# Patient Record
Sex: Male | Born: 1980 | Race: White | Hispanic: No | Marital: Single | Smoking: Current every day smoker
Health system: Southern US, Community
[De-identification: ages and names within clinical notes are randomized; demographics above are authoritative.]

## PROBLEM LIST (undated history)

## (undated) DIAGNOSIS — C719 Malignant neoplasm of brain, unspecified: Secondary | ICD-10-CM

## (undated) DIAGNOSIS — M1990 Unspecified osteoarthritis, unspecified site: Secondary | ICD-10-CM

## (undated) DIAGNOSIS — J45909 Unspecified asthma, uncomplicated: Secondary | ICD-10-CM

## (undated) DIAGNOSIS — G9389 Other specified disorders of brain: Secondary | ICD-10-CM

## (undated) DIAGNOSIS — R569 Unspecified convulsions: Secondary | ICD-10-CM

## (undated) HISTORY — PX: HERNIA REPAIR: SHX51

## (undated) HISTORY — PX: BACK SURGERY: SHX140

## (undated) HISTORY — PX: PILONIDAL CYST EXCISION: SHX744

---

## 1999-07-26 ENCOUNTER — Ambulatory Visit (HOSPITAL_COMMUNITY): Admission: RE | Admit: 1999-07-26 | Discharge: 1999-07-26 | Payer: Self-pay | Admitting: General Surgery

## 2000-07-11 ENCOUNTER — Ambulatory Visit (HOSPITAL_COMMUNITY): Admission: RE | Admit: 2000-07-11 | Discharge: 2000-07-11 | Payer: Self-pay | Admitting: General Surgery

## 2011-12-10 ENCOUNTER — Emergency Department: Payer: Self-pay | Admitting: Emergency Medicine

## 2014-08-12 ENCOUNTER — Other Ambulatory Visit: Payer: Self-pay | Admitting: Surgery

## 2015-08-13 ENCOUNTER — Inpatient Hospital Stay (HOSPITAL_COMMUNITY): Payer: BLUE CROSS/BLUE SHIELD

## 2015-08-13 ENCOUNTER — Inpatient Hospital Stay (HOSPITAL_COMMUNITY)
Admission: EM | Admit: 2015-08-13 | Discharge: 2015-08-14 | DRG: 055 | Disposition: A | Discharge status: Home / Self Care | Payer: BLUE CROSS/BLUE SHIELD | Attending: Internal Medicine | Admitting: Internal Medicine

## 2015-08-13 ENCOUNTER — Encounter (HOSPITAL_COMMUNITY): Payer: Self-pay | Admitting: Emergency Medicine

## 2015-08-13 ENCOUNTER — Emergency Department (HOSPITAL_COMMUNITY): Payer: BLUE CROSS/BLUE SHIELD

## 2015-08-13 DIAGNOSIS — F17200 Nicotine dependence, unspecified, uncomplicated: Secondary | ICD-10-CM | POA: Diagnosis present

## 2015-08-13 DIAGNOSIS — C711 Malignant neoplasm of frontal lobe: Principal | ICD-10-CM | POA: Diagnosis present

## 2015-08-13 DIAGNOSIS — Z79899 Other long term (current) drug therapy: Secondary | ICD-10-CM | POA: Diagnosis not present

## 2015-08-13 DIAGNOSIS — G9389 Other specified disorders of brain: Secondary | ICD-10-CM | POA: Diagnosis present

## 2015-08-13 DIAGNOSIS — R569 Unspecified convulsions: Secondary | ICD-10-CM

## 2015-08-13 DIAGNOSIS — G939 Disorder of brain, unspecified: Secondary | ICD-10-CM | POA: Diagnosis not present

## 2015-08-13 HISTORY — DX: Unspecified osteoarthritis, unspecified site: M19.90

## 2015-08-13 LAB — BASIC METABOLIC PANEL
Anion gap: 10 (ref 5–15)
BUN: 9 mg/dL (ref 6–20)
CHLORIDE: 104 mmol/L (ref 101–111)
CO2: 23 mmol/L (ref 22–32)
CREATININE: 1.11 mg/dL (ref 0.61–1.24)
Calcium: 9.4 mg/dL (ref 8.9–10.3)
GFR calc Af Amer: >60 mL/min (ref >60)
GFR calc non Af Amer: >60 mL/min (ref >60)
GLUCOSE: 115 mg/dL — AB (ref 65–99)
Potassium: 4.1 mmol/L (ref 3.5–5.1)
SODIUM: 137 mmol/L (ref 135–145)

## 2015-08-13 LAB — CBC
HCT: 45.5 % (ref 39.0–52.0)
Hemoglobin: 15.2 g/dL (ref 13.0–17.0)
MCH: 30.2 pg (ref 26.0–34.0)
MCHC: 33.4 g/dL (ref 30.0–36.0)
MCV: 90.3 fL (ref 78.0–100.0)
PLATELETS: 244 10*3/uL (ref 150–400)
RBC: 5.04 MIL/uL (ref 4.22–5.81)
RDW: 12 % (ref 11.5–15.5)
WBC: 6.7 10*3/uL (ref 4.0–10.5)

## 2015-08-13 LAB — URINALYSIS, ROUTINE W REFLEX MICROSCOPIC
Bilirubin Urine: NEGATIVE
Glucose, UA: NEGATIVE mg/dL
Hgb urine dipstick: NEGATIVE
KETONES UR: NEGATIVE mg/dL
LEUKOCYTES UA: NEGATIVE
NITRITE: NEGATIVE
PROTEIN: NEGATIVE mg/dL
Specific Gravity, Urine: 1.014 (ref 1.005–1.030)
pH: 5.5 (ref 5.0–8.0)

## 2015-08-13 LAB — RAPID URINE DRUG SCREEN, HOSP PERFORMED
Amphetamines: POSITIVE — AB
BARBITURATES: NOT DETECTED
Benzodiazepines: NOT DETECTED
Cocaine: NOT DETECTED
OPIATES: NOT DETECTED
TETRAHYDROCANNABINOL: POSITIVE — AB

## 2015-08-13 LAB — ETHANOL: Alcohol, Ethyl (B): <5 mg/dL (ref <5)

## 2015-08-13 LAB — CBG MONITORING, ED: Glucose-Capillary: 127 mg/dL — ABNORMAL HIGH (ref 65–99)

## 2015-08-13 MED ORDER — SODIUM CHLORIDE 0.9 % IV SOLN
500.0000 mg | Freq: Two times a day (BID) | INTRAVENOUS | Status: DC
  Administered 2015-08-14: 500 mg via INTRAVENOUS
  Filled 2015-08-13 (×3): qty 5

## 2015-08-13 MED ORDER — ONDANSETRON HCL 4 MG PO TABS: 4.0000 mg | ORAL_TABLET | Freq: Four times a day (QID) | ORAL | Status: DC | PRN

## 2015-08-13 MED ORDER — GADOBENATE DIMEGLUMINE 529 MG/ML IV SOLN
20.0000 mL | Freq: Once | INTRAVENOUS | Status: AC | PRN
Start: 2015-08-13 — End: 2015-08-13
  Administered 2015-08-13: 20 mL via INTRAVENOUS

## 2015-08-13 MED ORDER — ONDANSETRON HCL 4 MG/2ML IJ SOLN: 4.0000 mg | Freq: Four times a day (QID) | INTRAMUSCULAR | Status: DC | PRN

## 2015-08-13 MED ORDER — SODIUM CHLORIDE 0.9% FLUSH: 3.0000 mL | Freq: Two times a day (BID) | INTRAVENOUS | Status: DC

## 2015-08-13 MED ORDER — ACETAMINOPHEN 650 MG RE SUPP: 650.0000 mg | Freq: Four times a day (QID) | RECTAL | Status: DC | PRN

## 2015-08-13 MED ORDER — IBUPROFEN 400 MG PO TABS: 400.0000 mg | ORAL_TABLET | Freq: Four times a day (QID) | ORAL | Status: DC | PRN

## 2015-08-13 MED ORDER — ACETAMINOPHEN 325 MG PO TABS
650.0000 mg | ORAL_TABLET | Freq: Four times a day (QID) | ORAL | Status: DC | PRN
Start: 2015-08-13 — End: 2015-08-14

## 2015-08-13 MED ORDER — SODIUM CHLORIDE 0.9 % IV SOLN
1000.0000 mg | Freq: Once | INTRAVENOUS | Status: AC
  Administered 2015-08-13: 1000 mg via INTRAVENOUS
  Filled 2015-08-13: qty 10

## 2015-08-13 MED ORDER — AMPHETAMINE-DEXTROAMPHETAMINE 10 MG PO TABS
30.0000 mg | ORAL_TABLET | Freq: Every day | ORAL | Status: DC
  Administered 2015-08-14: 30 mg via ORAL
  Filled 2015-08-13: qty 3

## 2015-08-13 NOTE — Consult Note (Signed)
Reason for Consult:seizure Referring Physician:Browning PA-C  Joe Butler is an 35 y.o. male.  HPI: Joe Butler is a 35 y.o. male with No significant medical history presents to ED with seizures. History provided by family members.he was at work when he had a generalized seizure for 3 to 4 minutes, witnessed. He was confused for 40 minutes after the seizure episode followed with a moderate headache. He denies any complaijnts prior to the seizure activity . He denies using any drugs. U\DS is pending. Initial CT head showed Probable large mass within the left frontal lobe, measuring at least 4 x 3.7 cm on image 21 of series 201, but with suspected mass extension and/or associated edema within the more inferior aspects of the left frontal lobe. Neurology consulted , recommended 1gm IV keppra. He was referred to medical service for admission. I saw patient in consultation and reviewed his MRI with him and his family.  Past Medical History  Diagnosis Date  . Arthritis     Past Surgical History  Procedure Laterality Date  . Back surgery      No family history on file.  Social History:  reports that he has been smoking.  He does not have any smokeless tobacco history on file. He reports that he uses illicit drugs (Marijuana). He reports that he does not drink alcohol.  Allergies:  Allergies  Allergen Reactions  . Bee Venom Anaphylaxis  . Shellfish Allergy Anaphylaxis  . Latex Rash  . Iodine Rash    Medications: I have reviewed the patient's current medications.  Results for orders placed or performed during the hospital encounter of 08/13/15 (from the past 48 hour(s))  CBG monitoring, ED     Status: Abnormal   Collection Time: 08/13/15  1:05 PM  Result Value Ref Range   Glucose-Capillary 127 (H) 65 - 99 mg/dL  Basic metabolic panel - if new onset seizures     Status: Abnormal   Collection Time: 08/13/15  1:32 PM  Result Value Ref Range   Sodium 137 135 - 145 mmol/L    Potassium 4.1 3.5 - 5.1 mmol/L   Chloride 104 101 - 111 mmol/L   CO2 23 22 - 32 mmol/L   Glucose, Bld 115 (H) 65 - 99 mg/dL   BUN 9 6 - 20 mg/dL   Creatinine, Ser 1.11 0.61 - 1.24 mg/dL   Calcium 9.4 8.9 - 10.3 mg/dL   GFR calc non Af Amer >60 >60 mL/min   GFR calc Af Amer >60 >60 mL/min    Comment: (NOTE) The eGFR has been calculated using the CKD EPI equation. This calculation has not been validated in all clinical situations. eGFR's persistently <60 mL/min signify possible Chronic Kidney Disease.    Anion gap 10 5 - 15  CBC - if new onset seizures     Status: None   Collection Time: 08/13/15  1:32 PM  Result Value Ref Range   WBC 6.7 4.0 - 10.5 K/uL   RBC 5.04 4.22 - 5.81 MIL/uL   Hemoglobin 15.2 13.0 - 17.0 g/dL   HCT 45.5 39.0 - 52.0 %   MCV 90.3 78.0 - 100.0 fL   MCH 30.2 26.0 - 34.0 pg   MCHC 33.4 30.0 - 36.0 g/dL   RDW 12.0 11.5 - 15.5 %   Platelets 244 150 - 400 K/uL  Ethanol     Status: None   Collection Time: 08/13/15  3:32 PM  Result Value Ref Range   Alcohol, Ethyl (B) <  5 <5 mg/dL    Comment:        LOWEST DETECTABLE LIMIT FOR SERUM ALCOHOL IS 5 mg/dL FOR MEDICAL PURPOSES ONLY     Ct Head Wo Contrast  08/13/2015  CLINICAL DATA:  Seizure today.  Frontal headache. EXAM: CT HEAD WITHOUT CONTRAST TECHNIQUE: Contiguous axial images were obtained from the base of the skull through the vertex without intravenous contrast. COMPARISON:  None. FINDINGS: Brain: There is prominent mass effect within the left frontal lobe, with effacement of the left lateral ventricle and a rightward shift of the anterior falx. There is a probable mass within the left frontal lobe which measures at least 4 x 3.7 cm on image 21 of series 201. Suspected mass extension and/or associated edema within the more inferior aspects of the left frontal lobe, with possible extension across the anterior corpus callosum. Vascular: Majority of the visible 36intracranial vessels are hyperdense suggesting  pseudo-subarachnoid sign caused by surrounding brain edema. No convincing evidence of parenchymal or extra-axial hemorrhage. Skull: Negative for fracture or focal lesion. Sinuses/Orbits: No acute findings. Other: None. IMPRESSION: Probable large mass within the left frontal lobe, measuring at least 4 x 3.7 cm on image 21 of series 201, but with suspected mass extension and/or associated edema within the more inferior aspects of the left frontal lobe. There is associated mass effect on the frontal horn of the left lateral ventricle and rightward shift of the anterior falx. Leading differential possibilities include neoplastic masses such as GBM and lymphoma. Recommend MRI with contrast for further characterization. These results were called by telephone at the time of interpretation on 08/13/2015 at 3:05 pm to Dr. Montine Circle , who verbally acknowledged these results. Electronically Signed   By: Franki Cabot M.D.   On: 08/13/2015 15:10   Mr [name] NW Contrast  08/13/2015  CLINICAL DATA:  Witnessed seizure earlier today. Normal neurologic exam. EXAM: MRI HEAD WITHOUT AND WITH CONTRAST TECHNIQUE: Multiplanar, multiecho pulse sequences of the brain and surrounding structures were obtained without and with intravenous contrast. CONTRAST:  MultiHance 20 mL. COMPARISON:  CT head performed a few hours earlier. FINDINGS: Infiltrative LEFT frontal mass, mildly restricted on DWI over a 3 cm cross-sectional area. Infiltrative T2 and FLAIR hyperintensities throughout the entire frontal lobe, infiltration of the corpus callosum across the genu and body, also involving the LEFT insula, and LEFT temporal lobe as well as LEFT basal ganglia with similar abnormal signal in the RIGHT frontal lobe. Focal and confluent T2 and FLAIR hyperintensities in the RIGHT centrum semiovale and around the parieto-occipital periatrial regions, are possibly, but not clearly related. Diffuse mass effect in the LEFT frontal lobe with sulcal  effacement. Mild mass effect left-to-right, 4 mm, with slight LEFT uncal herniation, and brainstem rotation. Post infusion, there are scattered foci of enhancement, relatively minor and out of proportion to the degree of mass effect and T2 hyperintensity. The largest area of enhancement in the LEFT superior frontal cortex measures 16 x 7 x 18 mm (R-L x A-P x C-C). There is a curvilinear focus of enhancement also noted in the medial RIGHT frontal lobe (image 19 series 17). No areas of hemorrhage or central necrosis. Flow voids are maintained.  Extracranial soft tissues unremarkable. IMPRESSION: Infiltrative mass involving multiple brain regions, epicenter in the LEFT frontal lobe, with contralateral spread, mild restriction, disproportionate paucity of enhancement, and no central necrosis or hemorrhage. 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 Advanced, infiltrative, GBM cannot be excluded. Electronically Signed   By: Staci Righter  M.D.   On: 08/13/2015 18:22    Review of Systems - Negative except Headaches for 2 months. Recent back surgery for L5S1 disc herniation    Blood pressure 106/70, pulse 83, temperature 97.6 F (36.4 C), temperature source Oral, resp. rate 15, height 6' (1.829 m), weight 90.719 kg (200 lb), SpO2 95 %. Physical Exam  Constitutional: He is oriented to person, place, and time. He appears well-developed and well-nourished.  HENT:  Head: Normocephalic and atraumatic.  Eyes: Conjunctivae and EOM are normal. Pupils are equal, round, and reactive to light.  Neck: Normal range of motion. Neck supple.  Cardiovascular: Normal rate and regular rhythm.   Respiratory: Effort normal and breath sounds normal.  GI: Soft. Bowel sounds are normal.  Musculoskeletal: Normal range of motion.  Neurological: He is alert and oriented to person, place, and time. He has normal strength and normal reflexes. He displays no atrophy, no tremor and normal reflexes. No cranial nerve deficit or sensory deficit. He  exhibits normal muscle tone. He displays no seizure activity. Coordination normal. GCS eye subscore is 4. GCS verbal subscore is 5. GCS motor subscore is 6.  Reflex Scores:      Tricep reflexes are 2+ on the right side and 2+ on the left side.      Bicep reflexes are 2+ on the right side and 2+ on the left side.      Brachioradialis reflexes are 2+ on the right side and 2+ on the left side.      Patellar reflexes are 2+ on the right side and 2+ on the left side.      Achilles reflexes are 2+ on the right side and 2+ on the left side. Psychiatric: He has a normal mood and affect. His speech is normal. Judgment and thought content normal. Cognition and memory are normal.    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  Admit to Medicine Service.  Started on Keppra.  Will likely require stereotactic biopsy for this new infiltrative lesion, most likely a glioma.  Peggyann Shoals, MD 08/13/2015, 7:00 PM

## 2015-08-13 NOTE — ED Notes (Signed)
Pt arrives from work via [employer].  EMS reports pt had witnessed "full-body" seizure at work lasting apprx 3 minutes.  EMS reports pt's coworkers stated no fall, no injury trauma.  EMS reports pt was post-ictal with loss of bladder. Pt AO x 4 at this time, reports no hx seizures.

## 2015-08-13 NOTE — H&P (Addendum)
History and Physical    Joe Butler [id number] DOB: March 15, 1980 DOA: 08/13/2015  PCP: No primary care provider on file. Patient coming from: home  Chief Complaint: seizures.  HPI: Joe Butler is a 35 y.o. male with  No significant medical history presents to ED with seizures. History provided by family members.he was at work when he had a generalized seizure for 3 to 4 minutes, witnessed. He was confused for 40 minutes after the seizure episode followed with a moderate headache. He denies any complaijnts prior to the seizure activity . He denies using any drugs. U\DS is pending. Initial CT head showed Probable large mass within the left frontal lobe, measuring at least 4 x 3.7 cm on image 21 of series 201, but with suspected mass extension and/or associated edema within the more inferior aspects of the left frontal lobe. Neurology consulted , recommended 1gm IV keppra. He was referred to medical service for admission.   ED Course: WAS GIVEN IV KEPPRA. MRI DONE.   Review of Systems: As per HPI otherwise 10 point review of systems negative.    Past Medical History  Diagnosis Date  . Arthritis     Past Surgical History  Procedure Laterality Date  . Back surgery       reports that he has been smoking.  He does not have any smokeless tobacco history on file. He reports that he uses illicit drugs (Marijuana). He reports that he does not drink alcohol.  Allergies  Allergen Reactions  . Bee Venom Anaphylaxis  . Shellfish Allergy Anaphylaxis  . Latex Rash  . Iodine Rash    Family history Father has lung cancer with mets to brain undergoing chemo MI in father with PCI.    Prior to Admission medications   Medication Sig Start Date End Date Taking? Authorizing Provider  amphetamine-dextroamphetamine (ADDERALL) 30 MG tablet Take 30 mg by mouth daily.   Yes Historical Provider, MD  Desloratadine-Pseudoephedrine (CLARINEX-D 12 HOUR PO) Take 1 tablet by mouth daily as needed  (allergies).   Yes Historical Provider, MD    Physical Exam: Filed Vitals:   08/13/15 1430 08/13/15 1500 08/13/15 1530 08/13/15 1600  BP: 105/72 104/71 107/75 106/70  Pulse: 125 77 84 83  Temp:      TempSrc:      Resp: 19 19 18 15   Height:      Weight:      SpO2: 95% 96% 96% 95%      Constitutional: NAD, calm, comfortable Filed Vitals:   08/13/15 1430 08/13/15 1500 08/13/15 1530 08/13/15 1600  BP: 105/72 104/71 107/75 106/70  Pulse: 125 77 84 83  Temp:      TempSrc:      Resp: 19 19 18 15   Height:      Weight:      SpO2: 95% 96% 96% 95%   Eyes: PERRL, lids and conjunctivae normal ENMT: Mucous membranes are moist. Posterior pharynx clear of any exudate or lesions.Normal dentition.  Neck: normal, supple, no masses, no thyromegaly Respiratory: clear to auscultation bilaterally, no wheezing, no crackles. Normal respiratory effort. No accessory muscle use.  Cardiovascular: Regular rate and rhythm, no murmurs / rubs / gallops. No extremity edema. 2+ pedal pulses. No carotid bruits.  Abdomen: no tenderness, no masses palpated. No hepatosplenomegaly. Bowel sounds positive.  Musculoskeletal: no clubbing / cyanosis. No joint deformity upper and lower extremities. Good ROM, no contractures. Normal muscle tone.  Skin: no rashes, lesions, ulcers. No induration Neurologic: CN 2-12 grossly intact.  Sensation intact, DTR normal. Strength 5/5 in all 4.  Psychiatric: Normal judgment and insight. Alert and oriented x 3. Normal mood.     Labs on Admission: I have personally reviewed following labs and imaging studies  CBC:  Recent Labs Lab 08/13/15 1332  WBC 6.7  HGB 15.2  HCT 45.5  MCV 90.3  PLT [id number]   Basic Metabolic Panel:  Recent Labs Lab 08/13/15 1332  NA 137  K 4.1  CL 104  CO2 23  GLUCOSE 115*  BUN 9  CREATININE 1.11  CALCIUM 9.4   GFR: Estimated Creatinine Clearance: 101 mL/min (by C-G formula based on Cr of 1.11). Liver Function Tests: No results for  input(s): AST, ALT, ALKPHOS, BILITOT, PROT, ALBUMIN in the last 168 hours. No results for input(s): LIPASE, AMYLASE in the last 168 hours. No results for input(s): AMMONIA in the last 168 hours. Coagulation Profile: No results for input(s): INR, PROTIME in the last 168 hours. Cardiac Enzymes: No results for input(s): CKTOTAL, CKMB, CKMBINDEX, TROPONINI in the last 168 hours. BNP (last 3 results) No results for input(s): PROBNP in the last 8760 hours. HbA1C: No results for input(s): HGBA1C in the last 72 hours. CBG:  Recent Labs Lab 08/13/15 1305  GLUCAP 127*   Lipid Profile: No results for input(s): CHOL, HDL, LDLCALC, TRIG, CHOLHDL, LDLDIRECT in the last 72 hours. Thyroid Function Tests: No results for input(s): TSH, T4TOTAL, FREET4, T3FREE, THYROIDAB in the last 72 hours. Anemia Panel: No results for input(s): VITAMINB12, FOLATE, FERRITIN, TIBC, IRON, RETICCTPCT in the last 72 hours. Urine analysis: No results found for: COLORURINE, APPEARANCEUR, LABSPEC, PHURINE, GLUCOSEU, HGBUR, BILIRUBINUR, KETONESUR, PROTEINUR, UROBILINOGEN, NITRITE, LEUKOCYTESUR Sepsis Labs: !!!!!!!!!!!!!!!!!!!!!!!!!!!!!!!!!!!!!!!!!!!! @LABRCNTIP (procalcitonin:4,lacticidven:4) )No results found for this or any previous visit (from the past 240 hour(s)).   Radiological Exams on Admission: Ct Head Wo Contrast  08/13/2015  CLINICAL DATA:  Seizure today.  Frontal headache. EXAM: CT HEAD WITHOUT CONTRAST TECHNIQUE: Contiguous axial images were obtained from the base of the skull through the vertex without intravenous contrast. COMPARISON:  None. FINDINGS: Brain: There is prominent mass effect within the left frontal lobe, with effacement of the left lateral ventricle and a rightward shift of the anterior falx. There is a probable mass within the left frontal lobe which measures at least 4 x 3.7 cm on image 21 of series 201. Suspected mass extension and/or associated edema within the more inferior aspects of the left  frontal lobe, with possible extension across the anterior corpus callosum. Vascular: Majority of the visible 36intracranial vessels are hyperdense suggesting pseudo-subarachnoid sign caused by surrounding brain edema. No convincing evidence of parenchymal or extra-axial hemorrhage. Skull: Negative for fracture or focal lesion. Sinuses/Orbits: No acute findings. Other: None. IMPRESSION: Probable large mass within the left frontal lobe, measuring at least 4 x 3.7 cm on image 21 of series 201, but with suspected mass extension and/or associated edema within the more inferior aspects of the left frontal lobe. There is associated mass effect on the frontal horn of the left lateral ventricle and rightward shift of the anterior falx. Leading differential possibilities include neoplastic masses such as GBM and lymphoma. Recommend MRI with contrast for further characterization. These results were called by telephone at the time of interpretation on 08/13/2015 at 3:05 pm to Dr. Montine Circle , who verbally acknowledged these results. Electronically Signed   By: Franki Cabot M.D.   On: 08/13/2015 15:10   Mr Joe Butler [id number] Contrast  08/13/2015  CLINICAL DATA:  Witnessed seizure earlier  today. Normal neurologic exam. EXAM: MRI HEAD WITHOUT AND WITH CONTRAST TECHNIQUE: Multiplanar, multiecho pulse sequences of the brain and surrounding structures were obtained without and with intravenous contrast. CONTRAST:  MultiHance 20 mL. COMPARISON:  CT head performed a few hours earlier. FINDINGS: Infiltrative LEFT frontal mass, mildly restricted on DWI over a 3 cm cross-sectional area. Infiltrative T2 and FLAIR hyperintensities throughout the entire frontal lobe, infiltration of the corpus callosum across the genu and body, also involving the LEFT insula, and LEFT temporal lobe as well as LEFT basal ganglia with similar abnormal signal in the RIGHT frontal lobe. Focal and confluent T2 and FLAIR hyperintensities in the RIGHT centrum  semiovale and around the parieto-occipital periatrial regions, are possibly, but not clearly related. Diffuse mass effect in the LEFT frontal lobe with sulcal effacement. Mild mass effect left-to-right, 4 mm, with slight LEFT uncal herniation, and brainstem rotation. Post infusion, there are scattered foci of enhancement, relatively minor and out of proportion to the degree of mass effect and T2 hyperintensity. The largest area of enhancement in the LEFT superior frontal cortex measures 16 x 7 x 18 mm (R-L x A-P x C-C). There is a curvilinear focus of enhancement also noted in the medial RIGHT frontal lobe (image 19 series 17). No areas of hemorrhage or central necrosis. Flow voids are maintained.  Extracranial soft tissues unremarkable. IMPRESSION: Infiltrative mass involving multiple brain regions, epicenter in the LEFT frontal lobe, with contralateral spread, mild restriction, disproportionate paucity of enhancement, and no central necrosis or hemorrhage. 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 Advanced, infiltrative, GBM cannot be excluded. Electronically Signed   By: Staci Righter M.D.   On: 08/13/2015 18:22    EKG: Independently reviewed. Sinus tachy  Assessment/Plan Active Problems:   Brain mass   Seizures (HCC)   Infiltrate left frontal lobe brain mass ? Gliomatosis cerebri vs GBM; Admitted to telemetry, Neuro surgery consulted and plan for biopsy soon.  Pt refusing to take IV decadron  Iv prn ativan for seizures.  Seizure precautions    Seizures probably from the brain mass; IV keppra ordered as per neurology recommendations.  Neurology to follow.    Headache: post ictal headache,  Pain control.   DVT prophylaxis:scd's Code Status: full Family Communication: multiple family members at bedside.  Disposition Plan: pending further management. Consults called:neurology dr Leonel Ramsay, Dr Vertell Limber with neuro surgery.  Admission status: inpatient telemetry.    Hosie Poisson MD [facility] Pager [phone]  If 7PM-7AM, please contact night-coverage [email or web address] Password [facility]  08/13/2015, 6:49 PM

## 2015-08-13 NOTE — ED Notes (Signed)
Attempted to call report

## 2015-08-13 NOTE — ED Provider Notes (Signed)
CSN: [id number]     Arrival date & time 08/13/15  1256 History   First MD Initiated Contact with Patient 08/13/15 1324     Chief Complaint  Patient presents with  . Seizures     (Consider location/radiation/quality/duration/timing/severity/associated sxs/prior Treatment) HPI Comments: Patient presents to the emergency department with chief complaint of seizure. He had a witnessed seizure at work today which lasted approximately 3 minutes and involved full body shaking.  He was noted to be postictal for 20-30 minutes after the fact. He has no seizure history. He reports mild headache. He was lying on the ground when this happened, he did not fall. He denies any other associated symptoms now. He has not taken any medications for this. He denies any numbness, weakness, or tingling in his extremities.  The history is provided by the patient. No language interpreter was used.    Past Medical History  Diagnosis Date  . Arthritis    Past Surgical History  Procedure Laterality Date  . Back surgery     No family history on file. Social History  Substance Use Topics  . Smoking status: Current Every Day Smoker -- 1.00 packs/day  . Smokeless tobacco: None  . Alcohol Use: No    Review of Systems  Constitutional: Negative for fever and chills.  Respiratory: Negative for shortness of breath.   Cardiovascular: Negative for chest pain.  Gastrointestinal: Negative for nausea, vomiting, diarrhea and constipation.  Genitourinary: Negative for dysuria.  Neurological: Positive for seizures.  All other systems reviewed and are negative.     Allergies  Bee venom; Shellfish allergy; Latex; and Iodine  Home Medications   Prior to Admission medications   Medication Sig Start Date End Date Taking? Authorizing Provider  amphetamine-dextroamphetamine (ADDERALL) 30 MG tablet Take 30 mg by mouth daily.   Yes Historical Provider, MD  Desloratadine-Pseudoephedrine (CLARINEX-D 12 HOUR PO) Take 1  tablet by mouth daily as needed (allergies).   Yes Historical Provider, MD   BP 110/71 mmHg  Pulse 104  Temp(Src) 97.6 F (36.4 C) (Oral)  Resp 20  Ht 6' (1.829 m)  Wt 90.719 kg  BMI 27.12 kg/m2  SpO2 93% Physical Exam  Constitutional: He is oriented to person, place, and time. He appears well-developed and well-nourished.  HENT:  Head: Normocephalic and atraumatic.  No oral trauma  Eyes: Conjunctivae and EOM are normal. Pupils are equal, round, and reactive to light. Right eye exhibits no discharge. Left eye exhibits no discharge. No scleral icterus.  Neck: Normal range of motion. Neck supple. No JVD present.  Cardiovascular: Normal rate, regular rhythm and normal heart sounds.  Exam reveals no gallop and no friction rub.   No murmur heard. Pulmonary/Chest: Effort normal and breath sounds normal. No respiratory distress. He has no wheezes. He has no rales. He exhibits no tenderness.  Abdominal: Soft. He exhibits no distension and no mass. There is no tenderness. There is no rebound and no guarding.  Musculoskeletal: Normal range of motion. He exhibits no edema or tenderness.  Moves all extremities ROM and strength 5/5 throughout  Neurological: He is alert and oriented to person, place, and time.  CN 3-12 intact, speech is clear, movements are goal oriented, normal finger to nose, normal heel to shin, no pronator drift, sensation and strength intact throughout  Skin: Skin is warm and dry.  Psychiatric: He has a normal mood and affect. His behavior is normal. Judgment and thought content normal.  Nursing note and vitals reviewed.   ED  Course  Procedures (including critical care time) Results for orders placed or performed during the hospital encounter of [id number]  Basic metabolic panel - if new onset seizures  Result Value Ref Range   Sodium 137 135 - 145 mmol/L   Potassium 4.1 3.5 - 5.1 mmol/L   Chloride 104 101 - 111 mmol/L   CO2 23 22 - 32 mmol/L   Glucose, Bld 115 (H) 65 -  99 mg/dL   BUN 9 6 - 20 mg/dL   Creatinine, Ser 1.11 0.61 - 1.24 mg/dL   Calcium 9.4 8.9 - 10.3 mg/dL   GFR calc non Af Amer >60 >60 mL/min   GFR calc Af Amer >60 >60 mL/min   Anion gap 10 5 - 15  CBC - if new onset seizures  Result Value Ref Range   WBC 6.7 4.0 - 10.5 K/uL   RBC 5.04 4.22 - 5.81 MIL/uL   Hemoglobin 15.2 13.0 - 17.0 g/dL   HCT 45.5 39.0 - 52.0 %   MCV 90.3 78.0 - 100.0 fL   MCH 30.2 26.0 - 34.0 pg   MCHC 33.4 30.0 - 36.0 g/dL   RDW 12.0 11.5 - 15.5 %   Platelets 244 150 - 400 K/uL  CBG monitoring, ED  Result Value Ref Range   Glucose-Capillary 127 (H) 65 - 99 mg/dL   Ct Head Wo Contrast  08/13/2015  CLINICAL DATA:  Seizure today.  Frontal headache. EXAM: CT HEAD WITHOUT CONTRAST TECHNIQUE: Contiguous axial images were obtained from the base of the skull through the vertex without intravenous contrast. COMPARISON:  None. FINDINGS: Brain: There is prominent mass effect within the left frontal lobe, with effacement of the left lateral ventricle and a rightward shift of the anterior falx. There is a probable mass within the left frontal lobe which measures at least 4 x 3.7 cm on image 21 of series 201. Suspected mass extension and/or associated edema within the more inferior aspects of the left frontal lobe, with possible extension across the anterior corpus callosum. Vascular: Majority of the visible 36intracranial vessels are hyperdense suggesting pseudo-subarachnoid sign caused by surrounding brain edema. No convincing evidence of parenchymal or extra-axial hemorrhage. Skull: Negative for fracture or focal lesion. Sinuses/Orbits: No acute findings. Other: None. IMPRESSION: Probable large mass within the left frontal lobe, measuring at least 4 x 3.7 cm on image 21 of series 201, but with suspected mass extension and/or associated edema within the more inferior aspects of the left frontal lobe. There is associated mass effect on the frontal horn of the left lateral ventricle and  rightward shift of the anterior falx. Leading differential possibilities include neoplastic masses such as GBM and lymphoma. Recommend MRI with contrast for further characterization. These results were called by telephone at the time of interpretation on 08/13/2015 at 3:05 pm to Dr. Montine Circle , who verbally acknowledged these results. Electronically Signed   By: Franki Cabot M.D.   On: 08/13/2015 15:10    I have personally reviewed and evaluated these images and lab results as part of my medical decision-making.   MDM   Final diagnoses:  Seizure ([place])  Brain mass    Patient with first-time seizure today. Check labs, CT head, and reassess.  CT with concerning findings as above.  Discussed with Dr. Johnney Killian, who recommends neurosurgery consult.   3:31 PM Discussed patient with Dr. Vertell Limber, who will consult.  Recommends medicine admit and neurology consult.  3:45 PM Discussed patient with Dr. Leonel Ramsay of neurology, who recommends  giving loading dose of 1 g of Keppra, followed by 500 mg Keppra twice a day.  Advises that patient will not require EEG.  PCP is with [facility].  Patient will be an unassigned admission.  Appreciate consultation with Dr. Karleen Hampshire, who will admit the patient.      Montine Circle, PA-C 08/13/15 1606  Charlesetta Shanks, MD 08/24/15 [phone]

## 2015-08-14 ENCOUNTER — Other Ambulatory Visit (HOSPITAL_COMMUNITY): Payer: Self-pay | Admitting: Neurosurgery

## 2015-08-14 DIAGNOSIS — G9389 Other specified disorders of brain: Secondary | ICD-10-CM

## 2015-08-14 LAB — COMPREHENSIVE METABOLIC PANEL
ALBUMIN: 4.1 g/dL (ref 3.5–5.0)
ALK PHOS: 52 U/L (ref 38–126)
ALT: 19 U/L (ref 17–63)
ANION GAP: 10 (ref 5–15)
AST: 26 U/L (ref 15–41)
BILIRUBIN TOTAL: 1.1 mg/dL (ref 0.3–1.2)
BUN: 11 mg/dL (ref 6–20)
CALCIUM: 9.5 mg/dL (ref 8.9–10.3)
CHLORIDE: 106 mmol/L (ref 101–111)
CO2: 23 mmol/L (ref 22–32)
Creatinine, Ser: 1.03 mg/dL (ref 0.61–1.24)
GFR calc non Af Amer: >60 mL/min (ref >60)
Glucose, Bld: 89 mg/dL (ref 65–99)
POTASSIUM: 4.1 mmol/L (ref 3.5–5.1)
SODIUM: 139 mmol/L (ref 135–145)
Total Protein: 7 g/dL (ref 6.5–8.1)

## 2015-08-14 LAB — GLUCOSE, CAPILLARY: GLUCOSE-CAPILLARY: 132 mg/dL — AB (ref 65–99)

## 2015-08-14 LAB — CBC
HEMATOCRIT: 46.3 % (ref 39.0–52.0)
HEMOGLOBIN: 15.3 g/dL (ref 13.0–17.0)
MCH: 30.7 pg (ref 26.0–34.0)
MCHC: 33 g/dL (ref 30.0–36.0)
MCV: 92.8 fL (ref 78.0–100.0)
Platelets: 267 10*3/uL (ref 150–400)
RBC: 4.99 MIL/uL (ref 4.22–5.81)
RDW: 12.3 % (ref 11.5–15.5)
WBC: 9.4 10*3/uL (ref 4.0–10.5)

## 2015-08-14 MED ORDER — LEVETIRACETAM 500 MG PO TABS: 500.0000 mg | ORAL_TABLET | Freq: Two times a day (BID) | ORAL | Status: AC

## 2015-08-14 MED ORDER — PNEUMOCOCCAL VAC POLYVALENT 25 MCG/0.5ML IJ INJ: 0.5000 mL | INJECTION | INTRAMUSCULAR | Status: DC

## 2015-08-14 NOTE — Discharge Summary (Signed)
Physician Discharge Summary  Joe Butler [id number] DOB: 22-Jan-1981 DOA: 08/13/2015  PCP: No primary care provider on file.  Admit date: 08/13/2015 Discharge date: 08/14/2015  Admitted From: (Home) Disposition:  (Home )  Recommendations for Outpatient Follow-up:  1. Follow up with PCP in 1-2 weeks 2. Please obtain BMP/CBC in one week 3. Please follow up with Dr Vertell Limber with neuro surgery for the biopsy next week.     Discharge Condition:guarded.  CODE STATUS:full  Diet recommendation: Heart Healthy  Brief/Interim Summary:Joe Butler is a 35 y.o. 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   Pt refusing to take IV decadron   Seizure precautions Pt wanted to go home, advised not to drive, operate vehiicles, no swimming .    Seizures probably from the brain mass; IV keppra ordered as per neurology recommendations. changed to keppra bID on discharge.   Headache: post ictal headache,  Pain control.    Discharge Instructions  Discharge Instructions    Call MD for:  persistant dizziness or light-headedness    Complete by:  As directed      Call MD for:  persistant nausea and vomiting    Complete by:  As directed      Call MD for:  severe uncontrolled pain    Complete by:  As directed      Call MD for:  temperature >100.4    Complete by:  As directed      Diet general    Complete by:  As directed      Discharge instructions    Complete by:  As directed   Please follow up with Neuro surgery as recommended. Please follow up with PCP in one week. No driving, operating heavy machinery, perform activities at heights, swimming or participation in water activities until release by outpatient physician/ PCP/ Neuro surgery.  This has been discussed with patient.            Medication List    TAKE these medications        amphetamine-dextroamphetamine 30 MG tablet  Commonly known as:  ADDERALL  Take 30 mg by mouth daily.     CLARINEX-D 12 HOUR PO  Take 1 tablet by mouth daily as needed (allergies).     levETIRAcetam 500 MG tablet  Commonly known as:  KEPPRA  Take 1 tablet (500 mg total) by mouth 2 (two) times daily.           Follow-up Information    Follow up with STERN,JOSEPH D, MD. Schedule an appointment as soon as possible for a visit in 1 week.   Specialty:  Neurosurgery   Contact information:   [address] [place] [address] [place] [address] [phone]       Follow up with Primary Care Physician. Schedule an appointment as soon as possible for a visit in 1 week.     Allergies  Allergen Reactions  . Bee Venom Anaphylaxis  . Shellfish Allergy Anaphylaxis  . Latex Rash  . Iodine Rash    Consultations:  Neurology   Neuro surgery.    Procedures/Studies: Ct Head Wo Contrast  08/13/2015  CLINICAL DATA:  Seizure today.  Frontal headache. EXAM: CT HEAD WITHOUT CONTRAST TECHNIQUE: Contiguous axial images were obtained from the  base of the skull through the vertex without intravenous contrast. COMPARISON:  None. FINDINGS: Brain: There is prominent mass effect within the left frontal lobe, with effacement of the left lateral ventricle and a rightward shift of the anterior falx. There is a probable mass within the left frontal lobe which measures at least 4 x 3.7 cm on image 21 of series 201. Suspected mass extension and/or associated edema within the more inferior aspects of the left frontal lobe, with possible extension across the anterior corpus callosum. Vascular: Majority of the visible 36intracranial vessels are hyperdense suggesting pseudo-subarachnoid sign caused by surrounding brain edema. No convincing evidence of parenchymal or extra-axial hemorrhage. Skull: Negative for  fracture or focal lesion. Sinuses/Orbits: No acute findings. Other: None. IMPRESSION: Probable large mass within the left frontal lobe, measuring at least 4 x 3.7 cm on image 21 of series 201, but with suspected mass extension and/or associated edema within the more inferior aspects of the left frontal lobe. There is associated mass effect on the frontal horn of the left lateral ventricle and rightward shift of the anterior falx. Leading differential possibilities include neoplastic masses such as GBM and lymphoma. Recommend MRI with contrast for further characterization. These results were called by telephone at the time of interpretation on 08/13/2015 at 3:05 pm to Dr. Montine Circle , who verbally acknowledged these results. Electronically Signed   By: Franki Cabot M.D.   On: 08/13/2015 15:10   Mr [name] [id number] Contrast  08/13/2015  CLINICAL DATA:  Witnessed seizure earlier today. Normal neurologic exam. EXAM: MRI HEAD WITHOUT AND WITH CONTRAST TECHNIQUE: Multiplanar, multiecho pulse sequences of the brain and surrounding structures were obtained without and with intravenous contrast. CONTRAST:  MultiHance 20 mL. COMPARISON:  CT head performed a few hours earlier. FINDINGS: Infiltrative LEFT frontal mass, mildly restricted on DWI over a 3 cm cross-sectional area. Infiltrative T2 and FLAIR hyperintensities throughout the entire frontal lobe, infiltration of the corpus callosum across the genu and body, also involving the LEFT insula, and LEFT temporal lobe as well as LEFT basal ganglia with similar abnormal signal in the RIGHT frontal lobe. Focal and confluent T2 and FLAIR hyperintensities in the RIGHT centrum semiovale and around the parieto-occipital periatrial regions, are possibly, but not clearly related. Diffuse mass effect in the LEFT frontal lobe with sulcal effacement. Mild mass effect left-to-right, 4 mm, with slight LEFT uncal herniation, and brainstem rotation. Post infusion, there are scattered foci of  enhancement, relatively minor and out of proportion to the degree of mass effect and T2 hyperintensity. The largest area of enhancement in the LEFT superior frontal cortex measures 16 x 7 x 18 mm (R-L x A-P x C-C). There is a curvilinear focus of enhancement also noted in the medial RIGHT frontal lobe (image 19 series 17). No areas of hemorrhage or central necrosis. Flow voids are maintained.  Extracranial soft tissues unremarkable. IMPRESSION: Infiltrative mass involving multiple brain regions, epicenter in the LEFT frontal lobe, with contralateral spread, mild restriction, disproportionate paucity of enhancement, and no central necrosis or hemorrhage. 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 Advanced, infiltrative, GBM cannot be excluded. Electronically Signed   By: Staci Righter M.D.   On: 08/13/2015 18:22       Subjective: Cheerful , wants to g ohome   Discharge Exam: Filed Vitals:   08/14/15 0535 08/14/15 1257  BP: 111/68 116/79  Pulse: 57 67  Temp: 97.9 F (36.6 C) 97.7 F (36.5 C)  Resp: 19    Filed Vitals:  08/13/15 1945 08/13/15 2039 08/14/15 0535 08/14/15 1257  BP: 104/73 114/75 111/68 116/79  Pulse: 72 62 57 67  Temp:  97.9 F (36.6 C) 97.9 F (36.6 C) 97.7 F (36.5 C)  TempSrc:      Resp: 17 20 19    Height:  6' (1.829 m)    Weight:      SpO2: 94% 97% 97% 99%    General: Pt is alert, awake, not in acute distress Cardiovascular: RRR, S1/S2 +, no rubs, no gallops Respiratory: CTA bilaterally, no wheezing, no rhonchi Abdominal: Soft, NT, ND, bowel sounds + Extremities: no edema, no cyanosis    The results of significant diagnostics from this hospitalization (including imaging, microbiology, ancillary and laboratory) are listed below for reference.     Microbiology: No results found for this or any previous visit (from the past 240 hour(s)).   Labs: BNP (last 3 results) No results for input(s): BNP in the last 8760 hours. Basic Metabolic Panel:  Recent Labs Lab  08/13/15 1332 08/14/15 0559  NA 137 139  K 4.1 4.1  CL 104 106  CO2 23 23  GLUCOSE 115* 89  BUN 9 11  CREATININE 1.11 1.03  CALCIUM 9.4 9.5   Liver Function Tests:  Recent Labs Lab 08/14/15 0559  AST 26  ALT 19  ALKPHOS 52  BILITOT 1.1  PROT 7.0  ALBUMIN 4.1   No results for input(s): LIPASE, AMYLASE in the last 168 hours. No results for input(s): AMMONIA in the last 168 hours. CBC:  Recent Labs Lab 08/13/15 1332 08/14/15 0559  WBC 6.7 9.4  HGB 15.2 15.3  HCT 45.5 46.3  MCV 90.3 92.8  PLT 244 267   Cardiac Enzymes: No results for input(s): CKTOTAL, CKMB, CKMBINDEX, TROPONINI in the last 168 hours. BNP: Invalid input(s): POCBNP CBG:  Recent Labs Lab 08/13/15 1305 08/14/15 0810  GLUCAP 127* 132*   D-Dimer No results for input(s): DDIMER in the last 72 hours. Hgb A1c No results for input(s): HGBA1C in the last 72 hours. Lipid Profile No results for input(s): CHOL, HDL, LDLCALC, TRIG, CHOLHDL, LDLDIRECT in the last 72 hours. Thyroid function studies No results for input(s): TSH, T4TOTAL, T3FREE, THYROIDAB in the last 72 hours.  Invalid input(s): FREET3 Anemia work up No results for input(s): VITAMINB12, FOLATE, FERRITIN, TIBC, IRON, RETICCTPCT in the last 72 hours. Urinalysis    Component Value Date/Time   COLORURINE YELLOW 08/13/2015 1925   APPEARANCEUR CLEAR 08/13/2015 1925   LABSPEC 1.014 08/13/2015 1925   PHURINE 5.5 08/13/2015 1925   GLUCOSEU NEGATIVE 08/13/2015 1925   HGBUR NEGATIVE 08/13/2015 1925   BILIRUBINUR NEGATIVE 08/13/2015 1925   KETONESUR NEGATIVE 08/13/2015 1925   PROTEINUR NEGATIVE 08/13/2015 1925   NITRITE NEGATIVE 08/13/2015 1925   LEUKOCYTESUR NEGATIVE 08/13/2015 1925   Sepsis Labs Invalid input(s): PROCALCITONIN,  WBC,  LACTICIDVEN Microbiology No results found for this or any previous visit (from the past 240 hour(s)).   Time coordinating discharge: Over 30 minutes  SIGNED:   Hosie Poisson, MD  [facility] 08/14/2015, 1:54 PM Pager   If 7PM-7AM, please contact night-coverage [email or web address] Password [email or web address]

## 2015-08-14 NOTE — Progress Notes (Signed)
Subjective: Patient reports "I'm ok...want to be home"  Objective: Vital signs in last 24 hours: Temp:  [97.6 F (36.4 C)-97.9 F (36.6 C)] 97.9 F (36.6 C) (06/09 0535) Pulse Rate:  [57-125] 57 (06/09 0535) Resp:  [15-20] 19 (06/09 0535) BP: (101-120)/(66-77) 111/68 mmHg (06/09 0535) SpO2:  [92 %-97 %] 97 % (06/09 0535) Weight:  [90.719 kg (200 lb)] 90.719 kg (200 lb) (06/08 1302)  Intake/Output from previous day: 06/08 0701 - 06/09 0700 In: 105 [IV Piggyback:105] Out: -  Intake/Output this shift:    Alert, conversant. Family member present. PEARL. MAEW. No seizure activity overnight. No h/a.   Lab Results:  Recent Labs  08/13/15 1332 08/14/15 0559  WBC 6.7 9.4  HGB 15.2 15.3  HCT 45.5 46.3  PLT 244 267   BMET  Recent Labs  08/13/15 1332 08/14/15 0559  NA 137 139  K 4.1 4.1  CL 104 106  CO2 23 23  GLUCOSE 115* 89  BUN 9 11  CREATININE 1.11 1.03  CALCIUM 9.4 9.5    Studies/Results: Ct Head Wo Contrast  08/13/2015  CLINICAL DATA:  Seizure today.  Frontal headache. EXAM: CT HEAD WITHOUT CONTRAST TECHNIQUE: Contiguous axial images were obtained from the base of the skull through the vertex without intravenous contrast. COMPARISON:  None. FINDINGS: Brain: There is prominent mass effect within the left frontal lobe, with effacement of the left lateral ventricle and a rightward shift of the anterior falx. There is a probable mass within the left frontal lobe which measures at least 4 x 3.7 cm on image 21 of series 201. Suspected mass extension and/or associated edema within the more inferior aspects of the left frontal lobe, with possible extension across the anterior corpus callosum. Vascular: Majority of the visible 36intracranial vessels are hyperdense suggesting pseudo-subarachnoid sign caused by surrounding brain edema. No convincing evidence of parenchymal or extra-axial hemorrhage. Skull: Negative for fracture or focal lesion. Sinuses/Orbits: No acute findings.  Other: None. IMPRESSION: Probable large mass within the left frontal lobe, measuring at least 4 x 3.7 cm on image 21 of series 201, but with suspected mass extension and/or associated edema within the more inferior aspects of the left frontal lobe. There is associated mass effect on the frontal horn of the left lateral ventricle and rightward shift of the anterior falx. Leading differential possibilities include neoplastic masses such as GBM and lymphoma. Recommend MRI with contrast for further characterization. These results were called by telephone at the time of interpretation on 08/13/2015 at 3:05 pm to Dr. Montine Circle , who verbally acknowledged these results. Electronically Signed   By: Franki Cabot M.D.   On: 08/13/2015 15:10   Mr [name] [id number] Contrast  08/13/2015  CLINICAL DATA:  Witnessed seizure earlier today. Normal neurologic exam. EXAM: MRI HEAD WITHOUT AND WITH CONTRAST TECHNIQUE: Multiplanar, multiecho pulse sequences of the brain and surrounding structures were obtained without and with intravenous contrast. CONTRAST:  MultiHance 20 mL. COMPARISON:  CT head performed a few hours earlier. FINDINGS: Infiltrative LEFT frontal mass, mildly restricted on DWI over a 3 cm cross-sectional area. Infiltrative T2 and FLAIR hyperintensities throughout the entire frontal lobe, infiltration of the corpus callosum across the genu and body, also involving the LEFT insula, and LEFT temporal lobe as well as LEFT basal ganglia with similar abnormal signal in the RIGHT frontal lobe. Focal and confluent T2 and FLAIR hyperintensities in the RIGHT centrum semiovale and around the parieto-occipital periatrial regions, are possibly, but not clearly related. Diffuse mass  effect in the LEFT frontal lobe with sulcal effacement. Mild mass effect left-to-right, 4 mm, with slight LEFT uncal herniation, and brainstem rotation. Post infusion, there are scattered foci of enhancement, relatively minor and out of proportion to the  degree of mass effect and T2 hyperintensity. The largest area of enhancement in the LEFT superior frontal cortex measures 16 x 7 x 18 mm (R-L x A-P x C-C). There is a curvilinear focus of enhancement also noted in the medial RIGHT frontal lobe (image 19 series 17). No areas of hemorrhage or central necrosis. Flow voids are maintained.  Extracranial soft tissues unremarkable. IMPRESSION: Infiltrative mass involving multiple brain regions, epicenter in the LEFT frontal lobe, with contralateral spread, mild restriction, disproportionate paucity of enhancement, and no central necrosis or hemorrhage. 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 Advanced, infiltrative, GBM cannot be excluded. Electronically Signed   By: Staci Righter M.D.   On: 08/13/2015 18:22    Assessment/Plan:   LOS: 1 day  Discussed plan for stereotactic biopsy next week.    Joe Butler, Joe Butler 08/14/2015, 8:10 AM

## 2015-08-14 NOTE — Progress Notes (Signed)
Late entry for 08/13/15 at 2036:  Patient arrived from the ED, accompanied by family members.  This 35 year old male had a witnessed seizure while at work and was brought to the hospital via EMS.  Alert and oriented, the headache he reported earlier has subsided.  CT and MRI showed probable large mass within the left frontal lobe.  Neurology consulted and he is to see a neurosurgeon this day.  He has a steady gait, was oriented to the room.  Seizure precautions are maintained with pads on the bed, as well as suction and oxygen ready.  Safety has been maintained.

## 2015-08-17 ENCOUNTER — Other Ambulatory Visit: Payer: Self-pay | Admitting: Neurosurgery

## 2015-08-17 ENCOUNTER — Encounter (HOSPITAL_COMMUNITY): Payer: Self-pay | Admitting: General Practice

## 2015-08-17 NOTE — Progress Notes (Signed)
Pre-op phone call completed.  Patient instructed to have nothing to eat or drink to eat after midnight.  Patient instructed to take Keppra the morning of the procedure with a small sip of water.  Patient stated that he will arrive to [facility] and check in at admissions at [place] on 08/18/15.  The best number to reach the patient is [phone].

## 2015-08-18 ENCOUNTER — Ambulatory Visit (HOSPITAL_COMMUNITY)
Admission: RE | Admit: 2015-08-18 | Discharge: 2015-08-18 | Disposition: A | Discharge status: Home / Self Care | Payer: BLUE CROSS/BLUE SHIELD | Source: Ambulatory Visit | Attending: Neurosurgery | Admitting: Neurosurgery

## 2015-08-18 ENCOUNTER — Encounter (HOSPITAL_COMMUNITY): Payer: Self-pay | Admitting: Certified Registered Nurse Anesthetist

## 2015-08-18 ENCOUNTER — Ambulatory Visit (HOSPITAL_COMMUNITY): Payer: BLUE CROSS/BLUE SHIELD | Admitting: Certified Registered Nurse Anesthetist

## 2015-08-18 ENCOUNTER — Encounter (HOSPITAL_COMMUNITY)
Admission: RE | Disposition: A | Discharge status: Home / Self Care | Payer: Self-pay | Source: Ambulatory Visit | Attending: Neurosurgery

## 2015-08-18 DIAGNOSIS — F17200 Nicotine dependence, unspecified, uncomplicated: Secondary | ICD-10-CM | POA: Insufficient documentation

## 2015-08-18 DIAGNOSIS — R569 Unspecified convulsions: Secondary | ICD-10-CM | POA: Insufficient documentation

## 2015-08-18 DIAGNOSIS — G9389 Other specified disorders of brain: Secondary | ICD-10-CM

## 2015-08-18 DIAGNOSIS — G939 Disorder of brain, unspecified: Secondary | ICD-10-CM | POA: Diagnosis present

## 2015-08-18 HISTORY — DX: Unspecified convulsions: R56.9

## 2015-08-18 HISTORY — DX: Unspecified asthma, uncomplicated: J45.909

## 2015-08-18 HISTORY — PX: RADIOLOGY WITH ANESTHESIA: SHX6223

## 2015-08-18 LAB — BASIC METABOLIC PANEL
ANION GAP: 7 (ref 5–15)
BUN: 13 mg/dL (ref 6–20)
CALCIUM: 9.7 mg/dL (ref 8.9–10.3)
CHLORIDE: 110 mmol/L (ref 101–111)
CO2: 22 mmol/L (ref 22–32)
Creatinine, Ser: 0.88 mg/dL (ref 0.61–1.24)
GFR calc non Af Amer: >60 mL/min (ref >60)
GLUCOSE: 96 mg/dL (ref 65–99)
POTASSIUM: 4.3 mmol/L (ref 3.5–5.1)
Sodium: 139 mmol/L (ref 135–145)

## 2015-08-18 LAB — CBC
HEMATOCRIT: 46.2 % (ref 39.0–52.0)
HEMOGLOBIN: 15.9 g/dL (ref 13.0–17.0)
MCH: 31.1 pg (ref 26.0–34.0)
MCHC: 34.4 g/dL (ref 30.0–36.0)
MCV: 90.4 fL (ref 78.0–100.0)
Platelets: 254 10*3/uL (ref 150–400)
RBC: 5.11 MIL/uL (ref 4.22–5.81)
RDW: 11.9 % (ref 11.5–15.5)
WBC: 9.4 10*3/uL (ref 4.0–10.5)

## 2015-08-18 SURGERY — RADIOLOGY WITH ANESTHESIA
Anesthesia: General

## 2015-08-18 MED ORDER — LACTATED RINGERS IV SOLN: INTRAVENOUS | Status: DC

## 2015-08-18 MED ORDER — GADOBENATE DIMEGLUMINE 529 MG/ML IV SOLN
20.0000 mL | Freq: Once | INTRAVENOUS | Status: AC | PRN
  Administered 2015-08-18: 20 mL via INTRAVENOUS

## 2015-08-18 NOTE — Transfer of Care (Signed)
Immediate Anesthesia Transfer of Care Note  Patient: Joe Butler  Procedure(s) Performed: Procedure(s): MRI OF BRAIN WITH AND WITHOUT (N/A)  Patient Location: PACU  Anesthesia Type:General  Level of Consciousness: awake, alert  and oriented  Airway & Oxygen Therapy: Patient Spontanous Breathing  Post-op Assessment: Report given to RN, Post -op Vital signs reviewed and stable and Patient moving all extremities X 4  Post vital signs: Reviewed and stable  Last Vitals:  Filed Vitals:   08/18/15 0810 08/18/15 1352  BP: 107/62 118/78  Pulse: 85 66  Temp: 36.9 C 36.7 C  Resp: 18     Last Pain:  Filed Vitals:   08/18/15 1356  PainSc: 0-No pain      Patients Stated Pain Goal: 4 ([id number])  Complications: No apparent anesthesia complications

## 2015-08-18 NOTE — Anesthesia Preprocedure Evaluation (Addendum)
Anesthesia Evaluation  Patient identified by MRN, date of birth, ID band Patient awake    Reviewed: Allergy & Precautions, NPO status , Patient's Chart, lab work & pertinent test results  Airway Mallampati: II   Neck ROM: Full    Dental  (+) Teeth Intact, Dental Advisory Given   Pulmonary asthma , Current Smoker,    breath sounds clear to auscultation       Cardiovascular  Rhythm:Regular Rate:Normal     Neuro/Psych Seizures -, Poorly Controlled,     GI/Hepatic   Endo/Other    Renal/GU      Musculoskeletal  (+) Arthritis ,   Abdominal   Peds  Hematology   Anesthesia Other Findings   Reproductive/Obstetrics                            Anesthesia Physical Anesthesia Plan  ASA: II  Anesthesia Plan: General   Post-op Pain Management:    Induction: Intravenous  Airway Management Planned: LMA  Additional Equipment:   Intra-op Plan:   Post-operative Plan: Extubation in OR  Informed Consent: I have reviewed the patients History and Physical, chart, labs and discussed the procedure including the risks, benefits and alternatives for the proposed anesthesia with the patient or authorized representative who has indicated his/her understanding and acceptance.   Dental advisory given  Plan Discussed with: CRNA and Anesthesiologist  Anesthesia Plan Comments:         Anesthesia Quick Evaluation

## 2015-08-18 NOTE — Anesthesia Postprocedure Evaluation (Signed)
Anesthesia Post Note  Patient: Joe Butler  Procedure(s) Performed: Procedure(s) (LRB): MRI OF BRAIN WITH AND WITHOUT (N/A)  Patient location during evaluation: PACU Anesthesia Type: General Level of consciousness: awake Pain management: pain level controlled Vital Signs Assessment: post-procedure vital signs reviewed and stable Respiratory status: spontaneous breathing Cardiovascular status: stable Anesthetic complications: no    Last Vitals:  Filed Vitals:   08/18/15 1420 08/18/15 1422  BP:  117/79  Pulse:  55  Temp: 36.4 C   Resp:      Last Pain:  Filed Vitals:   08/18/15 1424  PainSc: 0-No pain                 EDWARDS,Isys Tietje

## 2015-08-19 ENCOUNTER — Encounter (HOSPITAL_COMMUNITY): Payer: Self-pay | Admitting: Radiology

## 2015-08-19 MED ORDER — CEFAZOLIN SODIUM-DEXTROSE 2-4 GM/100ML-% IV SOLN
2.0000 g | INTRAVENOUS | Status: AC
  Administered 2015-08-20: 2 g via INTRAVENOUS
  Filled 2015-08-19: qty 100

## 2015-08-19 MED FILL — Ondansetron HCl Inj 4 MG/2ML (2 MG/ML): INTRAMUSCULAR | Qty: 2 | Status: AC

## 2015-08-19 MED FILL — Lidocaine HCl IV Inj 20 MG/ML: INTRAVENOUS | Qty: 5 | Status: AC

## 2015-08-19 MED FILL — Propofol IV Emul 500 MG/50ML (10 MG/ML): INTRAVENOUS | Qty: 50 | Status: AC

## 2015-08-19 MED FILL — Fentanyl Citrate Preservative Free (PF) Inj 100 MCG/2ML: INTRAMUSCULAR | Qty: 2 | Status: AC

## 2015-08-19 NOTE — H&P (Signed)
Reason for Consult:seizure Referring Physician:Browning PA-C  Joe Butler is an 35 y.o. male.  HPI: Joe Butler is a 35 y.o. male with No significant medical history presents to ED with seizures. History provided by family members.he was at work when he had a generalized seizure for 3 to 4 minutes, witnessed. He was confused for 40 minutes after the seizure episode followed with a moderate headache. He denies any complaijnts prior to the seizure activity . He denies using any drugs. U\DS is pending. Initial CT head showed Probable large mass within the left frontal lobe, measuring at least 4 x 3.7 cm on image 21 of series 201, but with suspected mass extension and/or associated edema within the more inferior aspects of the left frontal lobe. Neurology consulted , recommended 1gm IV keppra. He was referred to medical service for admission. I saw patient in consultation and reviewed his MRI with him and his family.  Past Medical History  Diagnosis Date  . Arthritis     Past Surgical History  Procedure Laterality Date  . Back surgery      No family history on file.  Social History:  reports that he has been smoking. He does not have any smokeless tobacco history on file. He reports that he uses illicit drugs (Marijuana). He reports that he does not drink alcohol.  Allergies:  Allergies  Allergen Reactions  . Bee Venom Anaphylaxis  . Shellfish Allergy Anaphylaxis  . Latex Rash  . Iodine Rash    Medications: I have reviewed the patient's current medications.   Lab Results Last 48 Hours    Results for orders placed or performed during the hospital encounter of 08/13/15 (from the past 48 hour(s))  CBG monitoring, ED Status: Abnormal   Collection Time: 08/13/15 1:05 PM  Result Value Ref Range   Glucose-Capillary 127 (H) 65 - 99 mg/dL  Basic metabolic panel - if new onset seizures Status: Abnormal   Collection  Time: 08/13/15 1:32 PM  Result Value Ref Range   Sodium 137 135 - 145 mmol/L   Potassium 4.1 3.5 - 5.1 mmol/L   Chloride 104 101 - 111 mmol/L   CO2 23 22 - 32 mmol/L   Glucose, Bld 115 (H) 65 - 99 mg/dL   BUN 9 6 - 20 mg/dL   Creatinine, Ser 1.11 0.61 - 1.24 mg/dL   Calcium 9.4 8.9 - 10.3 mg/dL   GFR calc non Af Amer >60 >60 mL/min   GFR calc Af Amer >60 >60 mL/min    Comment: (NOTE) The eGFR has been calculated using the CKD EPI equation. This calculation has not been validated in all clinical situations. eGFR's persistently <60 mL/min signify possible Chronic Kidney Disease.    Anion gap 10 5 - 15  CBC - if new onset seizures Status: None   Collection Time: 08/13/15 1:32 PM  Result Value Ref Range   WBC 6.7 4.0 - 10.5 K/uL   RBC 5.04 4.22 - 5.81 MIL/uL   Hemoglobin 15.2 13.0 - 17.0 g/dL   HCT 45.5 39.0 - 52.0 %   MCV 90.3 78.0 - 100.0 fL   MCH 30.2 26.0 - 34.0 pg   MCHC 33.4 30.0 - 36.0 g/dL   RDW 12.0 11.5 - 15.5 %   Platelets 244 150 - 400 K/uL  Ethanol Status: None   Collection Time: 08/13/15 3:32 PM  Result Value Ref Range   Alcohol, Ethyl (B) <5 <5 mg/dL    Comment:   LOWEST DETECTABLE LIMIT  FOR SERUM ALCOHOL IS 5 mg/dL FOR MEDICAL PURPOSES ONLY        Imaging Results (Last 48 hours)    Ct Head Wo Contrast  08/13/2015 CLINICAL DATA: Seizure today. Frontal headache. EXAM: CT HEAD WITHOUT CONTRAST TECHNIQUE: Contiguous axial images were obtained from the base of the skull through the vertex without intravenous contrast. COMPARISON: None. FINDINGS: Brain: There is prominent mass effect within the left frontal lobe, with effacement of the left lateral ventricle and a rightward shift of the anterior falx. There is a probable mass within the left frontal lobe which measures at least 4 x 3.7 cm on image 21 of series 201. Suspected mass extension  and/or associated edema within the more inferior aspects of the left frontal lobe, with possible extension across the anterior corpus callosum. Vascular: Majority of the visible 36intracranial vessels are hyperdense suggesting pseudo-subarachnoid sign caused by surrounding brain edema. No convincing evidence of parenchymal or extra-axial hemorrhage. Skull: Negative for fracture or focal lesion. Sinuses/Orbits: No acute findings. Other: None. IMPRESSION: Probable large mass within the left frontal lobe, measuring at least 4 x 3.7 cm on image 21 of series 201, but with suspected mass extension and/or associated edema within the more inferior aspects of the left frontal lobe. There is associated mass effect on the frontal horn of the left lateral ventricle and rightward shift of the anterior falx. Leading differential possibilities include neoplastic masses such as GBM and lymphoma. Recommend MRI with contrast for further characterization. These results were called by telephone at the time of interpretation on 08/13/2015 at 3:05 pm to Dr. Montine Circle , who verbally acknowledged these results. Electronically Signed By: Franki Cabot M.D. On: 08/13/2015 15:10   Mr [name] GU Contrast  08/13/2015 CLINICAL DATA: Witnessed seizure earlier today. Normal neurologic exam. EXAM: MRI HEAD WITHOUT AND WITH CONTRAST TECHNIQUE: Multiplanar, multiecho pulse sequences of the brain and surrounding structures were obtained without and with intravenous contrast. CONTRAST: MultiHance 20 mL. COMPARISON: CT head performed a few hours earlier. FINDINGS: Infiltrative LEFT frontal mass, mildly restricted on DWI over a 3 cm cross-sectional area. Infiltrative T2 and FLAIR hyperintensities throughout the entire frontal lobe, infiltration of the corpus callosum across the genu and body, also involving the LEFT insula, and LEFT temporal lobe as well as LEFT basal ganglia with similar abnormal signal in the RIGHT frontal lobe. Focal and  confluent T2 and FLAIR hyperintensities in the RIGHT centrum semiovale and around the parieto-occipital periatrial regions, are possibly, but not clearly related. Diffuse mass effect in the LEFT frontal lobe with sulcal effacement. Mild mass effect left-to-right, 4 mm, with slight LEFT uncal herniation, and brainstem rotation. Post infusion, there are scattered foci of enhancement, relatively minor and out of proportion to the degree of mass effect and T2 hyperintensity. The largest area of enhancement in the LEFT superior frontal cortex measures 16 x 7 x 18 mm (R-L x A-P x C-C). There is a curvilinear focus of enhancement also noted in the medial RIGHT frontal lobe (image 19 series 17). No areas of hemorrhage or central necrosis. Flow voids are maintained. Extracranial soft tissues unremarkable. IMPRESSION: Infiltrative mass involving multiple brain regions, epicenter in the LEFT frontal lobe, with contralateral spread, mild restriction, disproportionate paucity of enhancement, and no central necrosis or hemorrhage. 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 Advanced, infiltrative, GBM cannot be excluded. Electronically Signed By: Staci Righter M.D. On: 08/13/2015 18:22     Review of Systems - Negative except Headaches for 2 months. Recent back surgery for  L5S1 disc herniation    Blood pressure 106/70, pulse 83, temperature 97.6 F (36.4 C), temperature source Oral, resp. rate 15, height 6' (1.829 m), weight 90.719 kg (200 lb), SpO2 95 %. Physical Exam  Constitutional: He is oriented to person, place, and time. He appears well-developed and well-nourished.  HENT:  Head: Normocephalic and atraumatic.  Eyes: Conjunctivae and EOM are normal. Pupils are equal, round, and reactive to light.  Neck: Normal range of motion. Neck supple.  Cardiovascular: Normal rate and regular rhythm.  Respiratory: Effort normal and breath sounds normal.  GI: Soft. Bowel sounds are normal.  Musculoskeletal: Normal range of  motion.  Neurological: He is alert and oriented to person, place, and time. He has normal strength and normal reflexes. He displays no atrophy, no tremor and normal reflexes. No cranial nerve deficit or sensory deficit. He exhibits normal muscle tone. He displays no seizure activity. Coordination normal. GCS eye subscore is 4. GCS verbal subscore is 5. GCS motor subscore is 6.  Reflex Scores:  Tricep reflexes are 2+ on the right side and 2+ on the left side.  Bicep reflexes are 2+ on the right side and 2+ on the left side.  Brachioradialis reflexes are 2+ on the right side and 2+ on the left side.  Patellar reflexes are 2+ on the right side and 2+ on the left side.  Achilles reflexes are 2+ on the right side and 2+ on the left side. Psychiatric: He has a normal mood and affect. His speech is normal. Judgment and thought content normal. Cognition and memory are normal.    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 Admit to Medicine Service. Started on Keppra. Will likely require stereotactic biopsy for this new infiltrative lesion, most likely a glioma.  Peggyann Shoals, MD 08/13/2015, 7:00 PM

## 2015-08-19 NOTE — Progress Notes (Signed)
Pt denies SOB, chest pain, and being under the care of a cardiologist. Pt denies having an echo and cardiac cath. Pt ,ade aware to stop Stop taking Aspirin, vitamin, fish oil , Clarinex- D ( last dose was 6/13) and herbal medications. Do not take any NSAIDs ie: Ibuprofen, Advil, Naproxen, BC and Goody Powder or any medication containing Aspirin. Pt verbalized understanding of all pre-op instructions.

## 2015-08-20 ENCOUNTER — Encounter (HOSPITAL_COMMUNITY)
Admission: RE | Disposition: A | Discharge status: Home / Self Care | Payer: Self-pay | Source: Ambulatory Visit | Attending: Neurosurgery

## 2015-08-20 ENCOUNTER — Inpatient Hospital Stay (HOSPITAL_COMMUNITY): Payer: BLUE CROSS/BLUE SHIELD | Admitting: Anesthesiology

## 2015-08-20 ENCOUNTER — Encounter (HOSPITAL_COMMUNITY): Payer: Self-pay | Admitting: *Deleted

## 2015-08-20 ENCOUNTER — Inpatient Hospital Stay (HOSPITAL_COMMUNITY)
Admission: RE | Admit: 2015-08-20 | Discharge: 2015-08-21 | DRG: 027 | Disposition: A | Discharge status: Home / Self Care | Payer: BLUE CROSS/BLUE SHIELD | Source: Ambulatory Visit | Attending: Neurosurgery | Admitting: Neurosurgery

## 2015-08-20 DIAGNOSIS — C719 Malignant neoplasm of brain, unspecified: Secondary | ICD-10-CM | POA: Diagnosis present

## 2015-08-20 DIAGNOSIS — Z91013 Allergy to seafood: Secondary | ICD-10-CM

## 2015-08-20 DIAGNOSIS — C711 Malignant neoplasm of frontal lobe: Secondary | ICD-10-CM | POA: Diagnosis present

## 2015-08-20 DIAGNOSIS — Z91040 Latex allergy status: Secondary | ICD-10-CM | POA: Diagnosis not present

## 2015-08-20 DIAGNOSIS — Z91030 Bee allergy status: Secondary | ICD-10-CM

## 2015-08-20 DIAGNOSIS — R569 Unspecified convulsions: Secondary | ICD-10-CM | POA: Diagnosis present

## 2015-08-20 DIAGNOSIS — F17200 Nicotine dependence, unspecified, uncomplicated: Secondary | ICD-10-CM | POA: Diagnosis present

## 2015-08-20 DIAGNOSIS — Z91041 Radiographic dye allergy status: Secondary | ICD-10-CM

## 2015-08-20 HISTORY — DX: Other specified disorders of brain: G93.89

## 2015-08-20 HISTORY — PX: APPLICATION OF CRANIAL NAVIGATION: SHX6578

## 2015-08-20 HISTORY — PX: PR DURAL GRAFT REPAIR,SPINE DEFECT: 63710

## 2015-08-20 LAB — MRSA PCR SCREENING: MRSA by PCR: NEGATIVE

## 2015-08-20 LAB — TYPE AND SCREEN
ABO/RH(D): B POS
ANTIBODY SCREEN: NEGATIVE

## 2015-08-20 LAB — ABO/RH: ABO/RH(D): B POS

## 2015-08-20 SURGERY — FRAMELESS STEREOTACTIC BIOPSY
Anesthesia: General

## 2015-08-20 MED ORDER — SUGAMMADEX SODIUM 500 MG/5ML IV SOLN
INTRAVENOUS | Status: DC | PRN
  Administered 2015-08-20: 400 mg via INTRAVENOUS

## 2015-08-20 MED ORDER — FENTANYL CITRATE (PF) 100 MCG/2ML IJ SOLN
INTRAMUSCULAR | Status: AC
  Filled 2015-08-20: qty 2

## 2015-08-20 MED ORDER — HEMOSTATIC AGENTS (NO CHARGE) OPTIME
TOPICAL | Status: DC | PRN
  Administered 2015-08-20: 1 via TOPICAL

## 2015-08-20 MED ORDER — FENTANYL CITRATE (PF) 100 MCG/2ML IJ SOLN
25.0000 ug | INTRAMUSCULAR | Status: DC | PRN
  Administered 2015-08-20: 50 ug via INTRAVENOUS

## 2015-08-20 MED ORDER — PROPOFOL 10 MG/ML IV BOLUS
INTRAVENOUS | Status: AC
  Filled 2015-08-20: qty 20

## 2015-08-20 MED ORDER — MIDAZOLAM HCL 5 MG/5ML IJ SOLN
INTRAMUSCULAR | Status: DC | PRN
  Administered 2015-08-20: 2 mg via INTRAVENOUS

## 2015-08-20 MED ORDER — FAMOTIDINE IN NACL 20-0.9 MG/50ML-% IV SOLN
20.0000 mg | Freq: Two times a day (BID) | INTRAVENOUS | Status: DC
  Administered 2015-08-20 – 2015-08-21 (×2): 20 mg via INTRAVENOUS
  Filled 2015-08-20 (×2): qty 50

## 2015-08-20 MED ORDER — DEXAMETHASONE 4 MG PO TABS: 4.0000 mg | ORAL_TABLET | Freq: Four times a day (QID) | ORAL | Status: DC

## 2015-08-20 MED ORDER — LACTATED RINGERS IV SOLN
INTRAVENOUS | Status: DC
  Administered 2015-08-20 (×3): via INTRAVENOUS

## 2015-08-20 MED ORDER — ACETAMINOPHEN 325 MG PO TABS: 650.0000 mg | ORAL_TABLET | ORAL | Status: DC | PRN

## 2015-08-20 MED ORDER — LABETALOL HCL 5 MG/ML IV SOLN: 10.0000 mg | INTRAVENOUS | Status: DC | PRN

## 2015-08-20 MED ORDER — LABETALOL HCL 5 MG/ML IV SOLN
INTRAVENOUS | Status: DC | PRN
  Administered 2015-08-20: 20 mg via INTRAVENOUS

## 2015-08-20 MED ORDER — 0.9 % SODIUM CHLORIDE (POUR BTL) OPTIME
TOPICAL | Status: DC | PRN
  Administered 2015-08-20 (×2): 1000 mL

## 2015-08-20 MED ORDER — ARTIFICIAL TEARS OP OINT
TOPICAL_OINTMENT | OPHTHALMIC | Status: DC | PRN
  Administered 2015-08-20: 1 via OPHTHALMIC

## 2015-08-20 MED ORDER — HYDROMORPHONE HCL 1 MG/ML IJ SOLN
0.5000 mg | INTRAMUSCULAR | Status: DC | PRN
  Administered 2015-08-20 (×2): 0.5 mg via INTRAVENOUS
  Filled 2015-08-20 (×2): qty 1

## 2015-08-20 MED ORDER — ACETAMINOPHEN 650 MG RE SUPP: 650.0000 mg | RECTAL | Status: DC | PRN

## 2015-08-20 MED ORDER — PROPOFOL 10 MG/ML IV BOLUS
INTRAVENOUS | Status: DC | PRN
  Administered 2015-08-20: 200 mg via INTRAVENOUS
  Administered 2015-08-20: 100 mg via INTRAVENOUS
  Administered 2015-08-20: 50 mg via INTRAVENOUS

## 2015-08-20 MED ORDER — ONDANSETRON HCL 4 MG/2ML IJ SOLN
INTRAMUSCULAR | Status: AC
  Filled 2015-08-20: qty 2

## 2015-08-20 MED ORDER — BISACODYL 10 MG RE SUPP: 10.0000 mg | Freq: Every day | RECTAL | Status: DC | PRN

## 2015-08-20 MED ORDER — DEXAMETHASONE 6 MG PO TABS
6.0000 mg | ORAL_TABLET | Freq: Four times a day (QID) | ORAL | Status: DC
  Administered 2015-08-20 – 2015-08-21 (×3): 6 mg via ORAL
  Filled 2015-08-20 (×3): qty 1

## 2015-08-20 MED ORDER — LACTATED RINGERS IV SOLN: INTRAVENOUS | Status: DC

## 2015-08-20 MED ORDER — DEXAMETHASONE 4 MG PO TABS: 4.0000 mg | ORAL_TABLET | Freq: Three times a day (TID) | ORAL | Status: DC

## 2015-08-20 MED ORDER — DOCUSATE SODIUM 100 MG PO CAPS
100.0000 mg | ORAL_CAPSULE | Freq: Two times a day (BID) | ORAL | Status: DC
  Administered 2015-08-20 – 2015-08-21 (×2): 100 mg via ORAL
  Filled 2015-08-20 (×2): qty 1

## 2015-08-20 MED ORDER — ROCURONIUM BROMIDE 100 MG/10ML IV SOLN
INTRAVENOUS | Status: DC | PRN
  Administered 2015-08-20: 50 mg via INTRAVENOUS
  Administered 2015-08-20: 10 mg via INTRAVENOUS

## 2015-08-20 MED ORDER — POTASSIUM CHLORIDE IN NACL 20-0.9 MEQ/L-% IV SOLN
INTRAVENOUS | Status: DC
  Administered 2015-08-20: 75 mL via INTRAVENOUS
  Filled 2015-08-20 (×3): qty 1000

## 2015-08-20 MED ORDER — POLYETHYLENE GLYCOL 3350 17 G PO PACK: 17.0000 g | PACK | Freq: Every day | ORAL | Status: DC | PRN

## 2015-08-20 MED ORDER — SUGAMMADEX SODIUM 500 MG/5ML IV SOLN
INTRAVENOUS | Status: AC
  Filled 2015-08-20: qty 5

## 2015-08-20 MED ORDER — SURGIFOAM 100 EX MISC
CUTANEOUS | Status: DC | PRN
  Administered 2015-08-20: 14:00:00 via TOPICAL

## 2015-08-20 MED ORDER — ONDANSETRON HCL 4 MG/2ML IJ SOLN
4.0000 mg | INTRAMUSCULAR | Status: DC | PRN
  Administered 2015-08-20: 4 mg via INTRAVENOUS
  Filled 2015-08-20 (×2): qty 2

## 2015-08-20 MED ORDER — BACITRACIN ZINC 500 UNIT/GM EX OINT
TOPICAL_OINTMENT | CUTANEOUS | Status: DC | PRN
  Administered 2015-08-20: 1 via TOPICAL

## 2015-08-20 MED ORDER — LEVETIRACETAM 500 MG PO TABS: 500.0000 mg | ORAL_TABLET | Freq: Two times a day (BID) | ORAL | Status: DC

## 2015-08-20 MED ORDER — LABETALOL HCL 5 MG/ML IV SOLN
INTRAVENOUS | Status: AC
Start: 2015-08-20 — End: 2015-08-20
  Filled 2015-08-20: qty 4

## 2015-08-20 MED ORDER — FLEET ENEMA 7-19 GM/118ML RE ENEM: 1.0000 | ENEMA | Freq: Once | RECTAL | Status: DC | PRN

## 2015-08-20 MED ORDER — FENTANYL CITRATE (PF) 250 MCG/5ML IJ SOLN
INTRAMUSCULAR | Status: AC
  Filled 2015-08-20: qty 5

## 2015-08-20 MED ORDER — SODIUM CHLORIDE 0.9 % IV SOLN
500.0000 mg | Freq: Two times a day (BID) | INTRAVENOUS | Status: DC
  Administered 2015-08-20 – 2015-08-21 (×2): 500 mg via INTRAVENOUS
  Filled 2015-08-20 (×3): qty 5

## 2015-08-20 MED ORDER — LIDOCAINE-EPINEPHRINE 1 %-1:100000 IJ SOLN
INTRAMUSCULAR | Status: DC | PRN
  Administered 2015-08-20: 6 mL

## 2015-08-20 MED ORDER — PROMETHAZINE HCL 25 MG PO TABS
12.5000 mg | ORAL_TABLET | ORAL | Status: DC | PRN
  Administered 2015-08-20: 25 mg via ORAL
  Filled 2015-08-20: qty 1

## 2015-08-20 MED ORDER — CEFAZOLIN SODIUM 1-5 GM-% IV SOLN
1.0000 g | Freq: Three times a day (TID) | INTRAVENOUS | Status: AC
  Administered 2015-08-20 – 2015-08-21 (×2): 1 g via INTRAVENOUS
  Filled 2015-08-20 (×3): qty 50

## 2015-08-20 MED ORDER — FENTANYL CITRATE (PF) 100 MCG/2ML IJ SOLN
INTRAMUSCULAR | Status: DC | PRN
  Administered 2015-08-20: 50 ug via INTRAVENOUS
  Administered 2015-08-20: 150 ug via INTRAVENOUS
  Administered 2015-08-20 (×3): 50 ug via INTRAVENOUS

## 2015-08-20 MED ORDER — AMPHETAMINE-DEXTROAMPHETAMINE 10 MG PO TABS
30.0000 mg | ORAL_TABLET | Freq: Every day | ORAL | Status: DC
  Administered 2015-08-21: 30 mg via ORAL
  Filled 2015-08-20: qty 3

## 2015-08-20 MED ORDER — ONDANSETRON HCL 4 MG/2ML IJ SOLN
INTRAMUSCULAR | Status: DC | PRN
  Administered 2015-08-20: 4 mg via INTRAVENOUS

## 2015-08-20 MED ORDER — LIDOCAINE HCL (CARDIAC) 20 MG/ML IV SOLN
INTRAVENOUS | Status: DC | PRN
  Administered 2015-08-20: 100 mg via INTRAVENOUS

## 2015-08-20 MED ORDER — FENTANYL CITRATE (PF) 250 MCG/5ML IJ SOLN
INTRAMUSCULAR | Status: AC
Start: 2015-08-20 — End: 2015-08-20
  Filled 2015-08-20: qty 5

## 2015-08-20 MED ORDER — MIDAZOLAM HCL 2 MG/2ML IJ SOLN
INTRAMUSCULAR | Status: AC
  Filled 2015-08-20: qty 2

## 2015-08-20 MED ORDER — HYDROCODONE-ACETAMINOPHEN 5-325 MG PO TABS
1.0000 | ORAL_TABLET | ORAL | Status: DC | PRN
  Administered 2015-08-21: 1 via ORAL
  Filled 2015-08-20: qty 1

## 2015-08-20 MED ORDER — DESLORATADINE-PSEUDOEPHED ER 2.5-120 MG PO TB12: 1.0000 | ORAL_TABLET | Freq: Every day | ORAL | Status: DC | PRN

## 2015-08-20 MED ORDER — ONDANSETRON HCL 4 MG PO TABS: 4.0000 mg | ORAL_TABLET | ORAL | Status: DC | PRN

## 2015-08-20 MED ORDER — OXYCODONE-ACETAMINOPHEN 5-325 MG PO TABS
1.0000 | ORAL_TABLET | ORAL | Status: DC | PRN
  Administered 2015-08-20 – 2015-08-21 (×2): 1 via ORAL
  Filled 2015-08-20 (×2): qty 1

## 2015-08-20 SURGICAL SUPPLY — 73 items
BANDAGE ADH SHEER 1  50/CT (GAUZE/BANDAGES/DRESSINGS) IMPLANT
BLADE CLIPPER SURG (BLADE) IMPLANT
BLADE SURG 10 STRL SS (BLADE) ×4 IMPLANT
BLADE SURG 15 STRL LF DISP TIS (BLADE) ×2 IMPLANT
BLADE SURG 15 STRL SS (BLADE) ×2
BUR ACORN 6.0 PRECISION (BURR) ×3 IMPLANT
BUR ACORN 6.0MM PRECISION (BURR) ×1
BUR SPIRAL ROUTER 2.3 (BUR) ×3 IMPLANT
BUR SPIRAL ROUTER 2.3MM (BUR) ×1
CANISTER SUCT 3000ML PPV (MISCELLANEOUS) ×4 IMPLANT
CONT SPEC 4OZ CLIKSEAL STRL BL (MISCELLANEOUS) ×8 IMPLANT
CORDS BIPOLAR (ELECTRODE) ×4 IMPLANT
COVER MAYO STAND STRL (DRAPES) ×4 IMPLANT
DECANTER SPIKE VIAL GLASS SM (MISCELLANEOUS) ×4 IMPLANT
DRAPE INCISE IOBAN 66X45 STRL (DRAPES) ×4 IMPLANT
DRAPE POUCH INSTRU U-SHP 10X18 (DRAPES) ×4 IMPLANT
DRSG OPSITE POSTOP 4X6 (GAUZE/BANDAGES/DRESSINGS) ×4 IMPLANT
DURAMATRIX ONLAY 2X2 (Neuro Prosthesis/Implant) ×4 IMPLANT
DURAPREP 26ML APPLICATOR (WOUND CARE) IMPLANT
ELECT CAUTERY BLADE 6.4 (BLADE) ×4 IMPLANT
ELECT REM PT RETURN 9FT ADLT (ELECTROSURGICAL) ×4
ELECTRODE REM PT RTRN 9FT ADLT (ELECTROSURGICAL) ×2 IMPLANT
FORCEPS BIPOLAR SPETZLER 8 1.0 (NEUROSURGERY SUPPLIES) ×4 IMPLANT
GAUZE SPONGE 4X4 16PLY XRAY LF (GAUZE/BANDAGES/DRESSINGS) ×4 IMPLANT
GLOVE BIO SURGEON STRL SZ8 (GLOVE) IMPLANT
GLOVE BIOGEL PI IND STRL 6.5 (GLOVE) ×2 IMPLANT
GLOVE BIOGEL PI IND STRL 7.0 (GLOVE) ×2 IMPLANT
GLOVE BIOGEL PI IND STRL 7.5 (GLOVE) ×2 IMPLANT
GLOVE BIOGEL PI IND STRL 8 (GLOVE) ×4 IMPLANT
GLOVE BIOGEL PI IND STRL 8.5 (GLOVE) IMPLANT
GLOVE BIOGEL PI INDICATOR 6.5 (GLOVE) ×2
GLOVE BIOGEL PI INDICATOR 7.0 (GLOVE) ×2
GLOVE BIOGEL PI INDICATOR 7.5 (GLOVE) ×2
GLOVE BIOGEL PI INDICATOR 8 (GLOVE) ×4
GLOVE BIOGEL PI INDICATOR 8.5 (GLOVE)
GLOVE ECLIPSE 7.5 STRL STRAW (GLOVE) IMPLANT
GLOVE EXAM NITRILE LRG STRL (GLOVE) IMPLANT
GLOVE EXAM NITRILE MD LF STRL (GLOVE) IMPLANT
GLOVE EXAM NITRILE XL STR (GLOVE) IMPLANT
GLOVE EXAM NITRILE XS STR PU (GLOVE) IMPLANT
GLOVE INDICATOR 7.0 STRL GRN (GLOVE) ×4 IMPLANT
GLOVE INDICATOR 7.5 STRL GRN (GLOVE) ×4 IMPLANT
GLOVE INDICATOR 8.0 STRL GRN (GLOVE) ×4 IMPLANT
GLOVE INDICATOR 8.5 STRL (GLOVE) ×4 IMPLANT
GOWN STRL REUS W/ TWL LRG LVL3 (GOWN DISPOSABLE) ×4 IMPLANT
GOWN STRL REUS W/ TWL XL LVL3 (GOWN DISPOSABLE) ×2 IMPLANT
GOWN STRL REUS W/TWL 2XL LVL3 (GOWN DISPOSABLE) ×4 IMPLANT
GOWN STRL REUS W/TWL LRG LVL3 (GOWN DISPOSABLE) ×4
GOWN STRL REUS W/TWL XL LVL3 (GOWN DISPOSABLE) ×2
KIT BASIN OR (CUSTOM PROCEDURE TRAY) ×4 IMPLANT
KIT ROOM TURNOVER OR (KITS) ×4 IMPLANT
MARKER SPHERE PSV REFLC 13MM (MARKER) ×8 IMPLANT
NEEDLE HYPO 25X1 1.5 SAFETY (NEEDLE) ×4 IMPLANT
NS IRRIG 1000ML POUR BTL (IV SOLUTION) ×4 IMPLANT
PACK EENT II TURBAN DRAPE (CUSTOM PROCEDURE TRAY) ×4 IMPLANT
PAD ARMBOARD 7.5X6 YLW CONV (MISCELLANEOUS) ×12 IMPLANT
PATTIES SURGICAL .5 X.5 (GAUZE/BANDAGES/DRESSINGS) ×4 IMPLANT
PENCIL BUTTON HOLSTER BLD 10FT (ELECTRODE) ×4 IMPLANT
PLATE 1.5/0.5 18.5MM BURR HOLE (Plate) ×8 IMPLANT
SCREW SELF DRILL HT 1.5/4MM (Screw) ×40 IMPLANT
SPONGE SURGIFOAM ABS GEL SZ50 (HEMOSTASIS) IMPLANT
STAPLER SKIN PROX WIDE 3.9 (STAPLE) ×4 IMPLANT
SUT BONE WAX W31G (SUTURE) ×4 IMPLANT
SUT VIC AB 2-0 CP2 18 (SUTURE) ×4 IMPLANT
SYR 5ML LUER SLIP (SYRINGE) ×8 IMPLANT
SYR BULB 3OZ (MISCELLANEOUS) ×4 IMPLANT
SYR CONTROL 10ML LL (SYRINGE) ×4 IMPLANT
TOWEL OR 17X24 6PK STRL BLUE (TOWEL DISPOSABLE) ×4 IMPLANT
TOWEL OR 17X26 10 PK STRL BLUE (TOWEL DISPOSABLE) ×4 IMPLANT
TRAY FOLEY BAG SILVER LF 16FR (SET/KITS/TRAYS/PACK) ×4 IMPLANT
TUBE CONNECTING 12'X1/4 (SUCTIONS) ×1
TUBE CONNECTING 12X1/4 (SUCTIONS) ×3 IMPLANT
WATER STERILE IRR 1000ML POUR (IV SOLUTION) ×4 IMPLANT

## 2015-08-20 NOTE — Op Note (Signed)
08/20/2015  3:53 PM  PATIENT:  Joe Butler  35 y.o. 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  POST-OPERATIVE DIAGNOSIS:  Brain Mass left frontal region with seizures  PROCEDURE:  Procedure(s):  Craniotomy with tumor biospy with BrainLab (Left) APPLICATION OF CRANIAL NAVIGATION (N/A)  SURGEON:  Surgeon(s) and Role:    * Erline Levine, MD - Primary    * Kevan Ny Ditty, MD - Assisting  PHYSICIAN ASSISTANT:   ASSISTANTS: Poteat, RN   ANESTHESIA:   general  EBL:  Total I/O In: 1200 [I.V.:1200] Out: 300 [Urine:200; Blood:100]  BLOOD ADMINISTERED:none  DRAINS: none   LOCAL MEDICATIONS USED:  LIDOCAINE   SPECIMEN:  Excision  DISPOSITION OF SPECIMEN:  PATHOLOGY  COUNTS:  YES  TOURNIQUET:  * No tourniquets in log *  DICTATION: Patient is 35 year old man with new onset seizures and multifocal brain tumor, felt most likely to be a glioma. Most of the tumor is not enhancing, but the left frontal portion is and it was felt this would be most amenable to biopsy as it came to the cortical surface.  It was elected to take him to surgery for craniotomy for left frontal brain tumor.  He had a BrainLab MRI for surgical localization of tumor.  Procedure:  Following smooth intubation, patient was placed in left semi-lateral position with blanket roll.  Head was placed in pins and left frontal scalp was shaved and prepped and draped in usual sterile fashion after BrainLab MRI was localized to map tumor location.  Area of planned incision was infiltrated with lidocaine. A linear incision was made and carried through temporalis fascia and muscle to expose calvarium.  Skull flap was elevated exposing the dura directly overlying the brain mass.  Dura was opened.  A corticotomy was created in the frontal lobe and carried to remove multiple brain biopsies.  The Neuro Navigation was used to confirm extent of tumor resection.  Hemostasis was assured with  irrigation and gelfoam.  Hemostasis was assured and the biopsy cavity was lined with Surgicell. The dura was patched with Dura Matrix graft. The bone flap was replaced with plates, the fascia and galea were closed with 2-0 vicryl sutures and the skin was re approximated with staples.  A sterile occlusive dressing was placed.  Patient was returned to a supine position and taken out of head pins, then extubated in the operating room, having tolerated surgery well.  Counts were correct at the end of the case.  PLAN OF CARE: Admit to inpatient   PATIENT DISPOSITION:  PACU - hemodynamically stable.   Delay start of Pharmacological VTE agent (>24hrs) due to surgical blood loss or risk of bleeding: yes

## 2015-08-20 NOTE — Transfer of Care (Signed)
Immediate Anesthesia Transfer of Care Note  Patient: Joe Butler  Procedure(s) Performed: Procedure(s):  Craniotomy with tumor biospy with BrainLab (Left) APPLICATION OF CRANIAL NAVIGATION (N/A)  Patient Location: PACU  Anesthesia Type:General  Level of Consciousness: awake, alert , oriented and patient cooperative  Airway & Oxygen Therapy: Patient Spontanous Breathing  Post-op Assessment: Report given to RN, Post -op Vital signs reviewed and stable and Patient moving all extremities X 4  Post vital signs: Reviewed and stable  Last Vitals:  Filed Vitals:   08/20/15 1051  BP: 112/81  Pulse: 75  Temp: 36.8 C  Resp: 18    Last Pain: There were no vitals filed for this visit.    Patients Stated Pain Goal: 4 ([id number])  Complications: No apparent anesthesia complications

## 2015-08-20 NOTE — Progress Notes (Signed)
Awake, alert, conversant.  MAEW.  No headache.  Doing well.

## 2015-08-20 NOTE — Progress Notes (Signed)
[facility] Progress Note Patient Name: Joe Butler DOB: 05/25/1980 MRN: [id number]   Date of Service  08/20/2015  HPI/Events of Note  Glioma resection stable  eICU Interventions  No eICU intervention     Intervention Category Evaluation Type: New Patient Evaluation  Simonne Maffucci 08/20/2015, 6:18 PM

## 2015-08-20 NOTE — Anesthesia Preprocedure Evaluation (Signed)
Anesthesia Evaluation  Patient identified by MRN, date of birth, ID band Patient awake    Reviewed: Allergy & Precautions, H&P , Patient's Chart, lab work & pertinent test results, reviewed documented beta blocker date and time   Airway Mallampati: II  TM Distance: >3 FB Neck ROM: full    Dental no notable dental hx.    Pulmonary Current Smoker,    Pulmonary exam normal breath sounds clear to auscultation       Cardiovascular  Rhythm:regular Rate:Normal     Neuro/Psych    GI/Hepatic   Endo/Other    Renal/GU      Musculoskeletal   Abdominal   Peds  Hematology   Anesthesia Other Findings Seizures Asthma... No inhaler   Reproductive/Obstetrics                             Anesthesia Physical Anesthesia Plan  ASA: II  Anesthesia Plan: General   Post-op Pain Management:    Induction: Intravenous  Airway Management Planned: Oral ETT  Additional Equipment: Arterial line  Intra-op Plan:   Post-operative Plan: Extubation in OR  Informed Consent: I have reviewed the patients History and Physical, chart, labs and discussed the procedure including the risks, benefits and alternatives for the proposed anesthesia with the patient or authorized representative who has indicated his/her understanding and acceptance.   Dental Advisory Given and Dental advisory given  Plan Discussed with: CRNA and Surgeon  Anesthesia Plan Comments: (  Discussed general anesthesia, including possible nausea, instrumentation of airway, sore throat,pulmonary aspiration, etc. I asked if the were any outstanding questions, or  concerns before we proceeded. )        Anesthesia Quick Evaluation

## 2015-08-20 NOTE — Brief Op Note (Signed)
08/20/2015  3:53 PM  PATIENT:  Joe Butler  35 y.o. 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  POST-OPERATIVE DIAGNOSIS:  Brain Mass left frontal region with seizures  PROCEDURE:  Procedure(s):  Craniotomy with tumor biospy with BrainLab (Left) APPLICATION OF CRANIAL NAVIGATION (N/A)  SURGEON:  Surgeon(s) and Role:    * Erline Levine, MD - Primary    * Kevan Ny Ditty, MD - Assisting  PHYSICIAN ASSISTANT:   ASSISTANTS: Poteat, RN   ANESTHESIA:   general  EBL:  Total I/O In: 1200 [I.V.:1200] Out: 300 [Urine:200; Blood:100]  BLOOD ADMINISTERED:none  DRAINS: none   LOCAL MEDICATIONS USED:  LIDOCAINE   SPECIMEN:  Excision  DISPOSITION OF SPECIMEN:  PATHOLOGY  COUNTS:  YES  TOURNIQUET:  * No tourniquets in log *  DICTATION: Patient is 35 year old man with new onset seizures and multifocal brain tumor, felt most likely to be a glioma. Most of the tumor is not enhancing, but the left frontal portion is and it was felt this would be most amenable to biopsy as it came to the cortical surface.  It was elected to take him to surgery for craniotomy for left frontal brain tumor.  He had a BrainLab MRI for surgical localization of tumor.  Procedure:  Following smooth intubation, patient was placed in left semi-lateral position with blanket roll.  Head was placed in pins and left frontal scalp was shaved and prepped and draped in usual sterile fashion after BrainLab MRI was localized to map tumor location.  Area of planned incision was infiltrated with lidocaine. A linear incision was made and carried through temporalis fascia and muscle to expose calvarium.  Skull flap was elevated exposing the dura directly overlying the brain mass.  Dura was opened.  A corticotomy was created in the frontal lobe and carried to remove multiple brain biopsies.  The Neuro Navigation was used to confirm extent of tumor resection.  Hemostasis was assured with  irrigation and gelfoam.  Hemostasis was assured and the biopsy cavity was lined with Surgicell. The dura was patched with Dura Matrix graft. The bone flap was replaced with plates, the fascia and galea were closed with 2-0 vicryl sutures and the skin was re approximated with staples.  A sterile occlusive dressing was placed.  Patient was returned to a supine position and taken out of head pins, then extubated in the operating room, having tolerated surgery well.  Counts were correct at the end of the case.  PLAN OF CARE: Admit to inpatient   PATIENT DISPOSITION:  PACU - hemodynamically stable.   Delay start of Pharmacological VTE agent (>24hrs) due to surgical blood loss or risk of bleeding: yes

## 2015-08-20 NOTE — Interval H&P Note (Signed)
History and Physical Interval Note:  08/20/2015 7:26 AM  Joe Butler  has presented today for surgery, with the diagnosis of Brain Mass  The various methods of treatment have been discussed with the patient and family. After consideration of risks, benefits and other options for treatment, the patient has consented to  Procedure(s) with comments: [place] (Left) - [place] (N/A) as a surgical intervention .  The patient's history has been reviewed, patient examined, no change in status, stable for surgery.  I have reviewed the patient's chart and labs.  Questions were answered to the patient's satisfaction.     Harneet Noblett D

## 2015-08-20 NOTE — Anesthesia Procedure Notes (Signed)
Procedure Name: Intubation Date/Time: 08/20/2015 2:10 PM Performed by: Judeth Cornfield T Pre-anesthesia Checklist: Patient identified, Emergency Drugs available, Suction available and Patient being monitored Patient Re-evaluated:Patient Re-evaluated prior to inductionOxygen Delivery Method: Circle System Utilized Preoxygenation: Pre-oxygenation with 100% oxygen Intubation Type: IV induction Ventilation: Mask ventilation without difficulty Laryngoscope Size: Mac and 3 Grade View: Grade I Tube type: Oral Tube size: 7.5 mm Number of attempts: 1 Airway Equipment and Method: Stylet,  Oral airway and Bite block Placement Confirmation: ETT inserted through vocal cords under direct vision,  positive ETCO2 and breath sounds checked- equal and bilateral Secured at: 23 cm Tube secured with: Tape Dental Injury: Teeth and Oropharynx as per pre-operative assessment

## 2015-08-21 ENCOUNTER — Encounter (HOSPITAL_COMMUNITY): Payer: Self-pay | Admitting: Neurosurgery

## 2015-08-21 MED ORDER — HYDROCODONE-ACETAMINOPHEN 5-325 MG PO TABS: 1.0000 | ORAL_TABLET | ORAL | Status: AC | PRN

## 2015-08-21 NOTE — Progress Notes (Addendum)
Patient's AVS and prescription given and reviewed with patient and patient's mother. Patient aware of how to care for surgical site, when to follow up and when to seek emergency help. Patient and mother aware that patient needs supervision and they understand. IV removed and neuro exam unchanged from prior assessments. Patient reported that he had all of his belongings with him for d/c. Patient d/c home with mom via wheelchair and NT escort.

## 2015-08-21 NOTE — Progress Notes (Signed)
Subjective: Patient reports "I feel fine. When I smile, it pulls on the staples a little bit, but thats all"  Objective: Vital signs in last 24 hours: Temp:  [97.7 F (36.5 C)-98.7 F (37.1 C)] 98.7 F (37.1 C) (06/16 0400) Pulse Rate:  [42-86] 63 (06/16 0500) Resp:  [11-38] 25 (06/16 0500) BP: (102-126)/(68-88) 102/70 mmHg (06/16 0403) SpO2:  [93 %-100 %] 95 % (06/16 0500) Arterial Line BP: (115-154)/(50-84) 120/53 mmHg (06/16 0500) FiO2 (%):  [21 %] 21 % (06/15 2026) Weight:  [81.3 kg (179 lb 3.7 oz)-90.719 kg (200 lb)] 81.3 kg (179 lb 3.7 oz) (06/15 1700)  Intake/Output from previous day: 06/15 0701 - 06/16 0700 In: 2825 [P.O.:480; I.V.:2090; IV Piggyback:255] Out: 2925 [Urine:2825; Blood:100] Intake/Output this shift:    Alert, conversant, smiling. PEARL. No drift. Tongue protrudes midline. MAEW. No headache. Reports only incisional discomfort with movement. Drsg inatct, incision without bleeding or swelling.   Lab Results:  Recent Labs  08/18/15 0803  WBC 9.4  HGB 15.9  HCT 46.2  PLT 254   BMET  Recent Labs  08/18/15 0803  NA 139  K 4.3  CL 110  CO2 22  GLUCOSE 96  BUN 13  CREATININE 0.88  CALCIUM 9.7    Studies/Results: No results found.  Assessment/Plan: Doing well post-op day 1   LOS: 1 day  Mobilize as tolerated   Jerrik, Loach 08/21/2015, 7:47 AM

## 2015-08-21 NOTE — Discharge Summary (Signed)
Physician Discharge Summary  Patient ID: Joe Butler MRN: [id number] DOB/AGE: [birth year] 36 y.o.  Admit date: 08/20/2015 Discharge date: 08/21/2015  Admission Diagnoses:Brain Tumor, Seizures  Discharge Diagnoses: Same Active Problems:   Brain tumor [facility])   Discharged Condition: good  Hospital Course: Patient was admitted for left frontal craniotomy for biopsy of infiltrating brain tumor. He did well with surgery, was mobilized on POD 1 and discharged home.  Consults: None  Significant Diagnostic Studies: None  Treatments: surgery:  left frontal craniotomy for biopsy of infiltrating brain tumor  Discharge Exam: Blood pressure 102/70, pulse 56, temperature 98.7 F (37.1 C), temperature source Oral, resp. rate 18, height 6' (1.829 m), weight 81.3 kg (179 lb 3.7 oz), SpO2 97 %. Neurologic: Alert and oriented X 3, normal strength and tone. Normal symmetric reflexes. Normal coordination and gait Wound:CDI  Disposition: Home     Medication List    TAKE these medications        amphetamine-dextroamphetamine 30 MG tablet  Commonly known as:  ADDERALL  Take 30 mg by mouth daily.     CLARINEX-D 12 HOUR PO  Take 1 tablet by mouth daily as needed (allergies).     HYDROcodone-acetaminophen 5-325 MG tablet  Commonly known as:  NORCO/VICODIN  Take 1-2 tablets by mouth every 4 (four) hours as needed for moderate pain or severe pain.     levETIRAcetam 500 MG tablet  Commonly known as:  KEPPRA  Take 1 tablet (500 mg total) by mouth 2 (two) times daily.         Signed: Peggyann Shoals, MD 08/21/2015, 8:37 AM

## 2015-08-24 ENCOUNTER — Telehealth: Payer: Self-pay | Admitting: Hematology and Oncology

## 2015-08-24 NOTE — Telephone Encounter (Signed)
Confirmed with pt regarding referral and appt date/time for 6/26 appts. Completed intake with pt.

## 2015-08-24 NOTE — Anesthesia Postprocedure Evaluation (Signed)
Anesthesia Post Note  Patient: Joe Butler  Procedure(s) Performed: Procedure(s) (LRB):  Craniotomy with tumor biospy with BrainLab (Left) APPLICATION OF CRANIAL NAVIGATION (N/A)  Patient location during evaluation: PACU Anesthesia Type: General Level of consciousness: sedated Pain management: satisfactory to patient Vital Signs Assessment: post-procedure vital signs reviewed and stable Respiratory status: spontaneous breathing Cardiovascular status: stable Anesthetic complications: no    Last Vitals:  Filed Vitals:   08/21/15 0900 08/21/15 1000  BP: 133/69 132/85  Pulse: 102 116  Temp:    Resp: 17 18    Last Pain:  Filed Vitals:   08/21/15 1041  PainSc: Asleep                 Emeric Novinger EDWARD

## 2015-08-31 ENCOUNTER — Encounter: Payer: Self-pay | Admitting: Radiation Oncology

## 2015-08-31 ENCOUNTER — Ambulatory Visit
Admission: RE | Admit: 2015-08-31 | Discharge: 2015-08-31 | Disposition: A | Discharge status: Home / Self Care | Payer: BLUE CROSS/BLUE SHIELD | Source: Ambulatory Visit | Attending: Radiation Oncology | Admitting: Radiation Oncology

## 2015-08-31 ENCOUNTER — Encounter: Payer: Self-pay | Admitting: Hematology and Oncology

## 2015-08-31 ENCOUNTER — Telehealth: Payer: Self-pay | Admitting: Hematology and Oncology

## 2015-08-31 ENCOUNTER — Ambulatory Visit (HOSPITAL_BASED_OUTPATIENT_CLINIC_OR_DEPARTMENT_OTHER): Payer: BLUE CROSS/BLUE SHIELD | Admitting: Hematology and Oncology

## 2015-08-31 VITALS — BP 108/72 | HR 65 | Temp 98.4°F | Resp 18 | Wt 182.1 lb

## 2015-08-31 VITALS — BP 116/65 | HR 72 | Resp 16 | Ht 72.0 in | Wt 183.0 lb

## 2015-08-31 DIAGNOSIS — C719 Malignant neoplasm of brain, unspecified: Secondary | ICD-10-CM | POA: Insufficient documentation

## 2015-08-31 DIAGNOSIS — Z7189 Other specified counseling: Secondary | ICD-10-CM | POA: Diagnosis not present

## 2015-08-31 DIAGNOSIS — Z515 Encounter for palliative care: Secondary | ICD-10-CM | POA: Diagnosis not present

## 2015-08-31 DIAGNOSIS — Z510 Encounter for antineoplastic radiation therapy: Secondary | ICD-10-CM | POA: Insufficient documentation

## 2015-08-31 DIAGNOSIS — C711 Malignant neoplasm of frontal lobe: Secondary | ICD-10-CM

## 2015-08-31 DIAGNOSIS — F17210 Nicotine dependence, cigarettes, uncomplicated: Secondary | ICD-10-CM | POA: Insufficient documentation

## 2015-08-31 HISTORY — DX: Malignant neoplasm of brain, unspecified: C71.9

## 2015-08-31 MED ORDER — TEMOZOLOMIDE 140 MG PO CAPS: 75.0000 mg/m2/d | ORAL_CAPSULE | Freq: Every day | ORAL | Status: DC

## 2015-08-31 MED ORDER — LORAZEPAM 0.5 MG PO TABS: ORAL_TABLET | ORAL | Status: AC

## 2015-08-31 NOTE — Telephone Encounter (Signed)
appt made and avs printed °

## 2015-08-31 NOTE — Assessment & Plan Note (Signed)
Left frontal lobe GBM: Unresectable disease. Diagnosed when the patient presented with seizures and was unconscious at the [employer] (he he transports cars for hearing).  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 The classification ranges from [facility] grade 1-4. WHO grade 4 glioma is glioblastoma. I discussed the pathology report and provided him with a copy of this report.  Treatment recommendation: Concurrent chemoradiation with daily temozolomide at 75 mg/m (180 mg dose) followed by maintenance Temodar 5 days every month (first month 150 mg/m, second month onwards 200 mg/m) X 12 months  Temodar counseling: I discussed the risks and benefits of temozolomide including the risk of nausea, fatigue, alopecia, diarrhea, cytopenias especially neutropenia and thrombocytopenia, risk of viral infections, risk of pneumocystis carinii night infection. Patient understands these risks and consented to proceed with treatment  Prognosis: Overall survival was significantly improved in patients randomly assigned to receive radiation plus temozolomide compared with radiation alone (9.3 versus 7.6 months). Progression-free survival was also improved (5.3 versus 3.9 months). However most of these trials were done in older individuals who tend to have poor prognosis. It is difficult to determine prognosis in him because of his young age of onset. But the patient understands that he has incurable cancer. Prognosis could be less than 1 year average survival. He understands the relapses are also very common. 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   Return to clinic 2 weeks after starting temozolomide and radiation.

## 2015-08-31 NOTE — Progress Notes (Signed)
See progress note under physician encounters. 

## 2015-08-31 NOTE — Progress Notes (Signed)
[place]         [phone] ________________________________  Initial Outpatient Consultation  Name: EDGARD BROOMFIELD MRN: [id number]  Date: 08/31/2015  DOB: Dec 17, 1980  REFERRING PHYSICIAN: Erline Levine, MD  DIAGNOSIS: 35 y.o. gentleman with an unresectable left frontal glioblastoma    ICD-9-CM ICD-10-CM   1. GBM (glioblastoma multiforme) (HCC) 191.9 C71.9   2. Unresectable glioblastoma of the left frontal lobe of brain ([place]) 191.1 C71.1    HISTORY OF PRESENT ILLNESS::Sayeed K Podgorny is a 35 y.o. male who presented to the ED on 08/13/15 following a seizure at work which lasted approximately 3 minutes and involved full body shaking. He was noted to be postictal for 20-30 minutes after the fact. He has no history of seizures. CT of the head w/o contrast revealed a 4 x 3.7 cm probable mass in the left frontal lobe with suspected mass extension and/or associated edema in the left frontal lobe. MRI of the brain showed an infiltrative mass involving multiple brain regions with the epicenter in the left frontal lobe. The patient had a consultation with Dr. Vertell Limber, that day, who started the patient on Keppra and recommended stereotactic biopsy of the brain lesion. Repeat MRI of the brain on 08/18/15 showed the unchanged appearance of the multilobar brain mass. Biopsy of the left frontal brain tumor, on 08/20/15, revealed glioblastoma (WHO grade 4). The patient has tapered off of [place] with the patient reporting the last tablet on 08/28/15. He continues to take Keppra mg bid.  PREVIOUS RADIATION THERAPY: No  Past Medical History  Diagnosis Date  . Seizures ([place])   . Arthritis   . Asthma     as child  . Brain mass   . GBM (glioblastoma multiforme) ([place])   :   Past Surgical History  Procedure Laterality Date  . Back surgery    . Hernia repair    . Pilonidal cyst excision    . Radiology with anesthesia N/A 08/18/2015    Procedure: MRI OF BRAIN WITH AND WITHOUT;   Surgeon: Medication Radiologist, MD;  Location: [facility];  Service: Radiology;  Laterality: N/A;  . Pr dural graft repair,spine defect Left 08/20/2015    Procedure:  Craniotomy with tumor biospy with BrainLab;  Surgeon: Erline Levine, MD;  Location: [place] NEURO ORS;  Service: Neurosurgery;  Laterality: Left;  . Application of cranial navigation N/A 08/20/2015    Procedure: APPLICATION OF CRANIAL NAVIGATION;  Surgeon: Erline Levine, MD;  Location: [place] NEURO ORS;  Service: Neurosurgery;  Laterality: N/A;  :   Current outpatient prescriptions:  .  amphetamine-dextroamphetamine (ADDERALL) 30 MG tablet, Take by mouth., Disp: , Rfl:  .  Desloratadine-Pseudoephedrine (CLARINEX-D 12 HOUR PO), Take 1 tablet by mouth daily as needed (allergies)., Disp: , Rfl:  .  HYDROcodone-acetaminophen (NORCO/VICODIN) 5-325 MG tablet, Take 1-2 tablets by mouth every 4 (four) hours as needed for moderate pain or severe pain., Disp: 60 tablet, Rfl: 0 .  levETIRAcetam (KEPPRA) 500 MG tablet, Take 1 tablet (500 mg total) by mouth 2 (two) times daily., Disp: 60 tablet, Rfl: 0 .  loratadine-pseudoephedrine (CLARITIN-D 24-HOUR) 10-240 MG 24 hr tablet, Take by mouth., Disp: , Rfl:  .  olopatadine (PATANOL) 0.1 % ophthalmic solution, PLACE 1 DROP INTO BOTH EYES 2 TIMES A DAY, Disp: , Rfl: 1 .  EPINEPHrine (EPIPEN 2-PAK) 0.3 mg/0.3 mL IJ SOAJ injection, Inject into the muscle. Reported on 08/31/2015, Disp: , Rfl:  .  LORazepam (ATIVAN) 0.5 MG tablet, 30 minutes prior to  radiation planning or treatments, Disp: 90 tablet, Rfl: 0:   Allergies  Allergen Reactions  . Bee Venom Anaphylaxis and Rash  . Shellfish Allergy Anaphylaxis  . Insect Extract Allergy Skin Test Rash and Other (See Comments)    Allergic to ants per patient  . Latex Rash  . Iodine Rash  :   Family History  Problem Relation Age of Onset  . Diabetes Father   . Hypertension Father   . Cancer - Lung Father   :   Social History   Social History  . Marital  Status: Single    Spouse Name: N/A  . Number of Children: N/A  . Years of Education: N/A   Occupational History  . Not on file.   Social History Main Topics  . Smoking status: Current Every Day Smoker -- 1.00 packs/day    Types: Cigarettes  . Smokeless tobacco: Never Used  . Alcohol Use: No     Comment: rare  . Drug Use: Yes    Special: Marijuana     Comment: pt reports daily marijuana smoker  . Sexual Activity: Yes   Other Topics Concern  . Not on file   Social History Narrative  :  REVIEW OF SYSTEMS:  A 15 point review of systems is documented in the electronic medical record. This was obtained by the nursing staff. However, I reviewed this with the patient to discuss relevant findings and make appropriate changes.  Pertinent items noted in HPI and remainder of comprehensive ROS otherwise negative.   ROS: The patient reports seizures, daily headaches as a 8/10 (with Norco "just takes the edge off"), one episode of nausea and emesis last week (but none since), and dizziness. He denies diplopia. The patient denies any episodes of confusion or change in short term memory. However, his mother reports a slight decline of the patient's short term memory.   PHYSICAL EXAM:  Blood pressure 116/65, pulse 72, resp. rate 16, height 6' (1.829 m), weight 183 lb (83.008 kg), SpO2 100 %. Neurologically intact. Well healed left frontal craniotomy incision.  KPS = 80  100 - Normal; no complaints; no evidence of disease. 90   - Able to carry on normal activity; minor signs or symptoms of disease. 80   - Normal activity with effort; some signs or symptoms of disease. 40   - Cares for self; unable to carry on normal activity or to do active work. 60   - Requires occasional assistance, but is able to care for most of his personal needs. 50   - Requires considerable assistance and frequent medical care. 34   - Disabled; requires special care and assistance. 39   - Severely disabled; hospital  admission is indicated although death not imminent. 27   - Very sick; hospital admission necessary; active supportive treatment necessary. 10   - Moribund; fatal processes progressing rapidly. 0     - Dead  Karnofsky DA, Abelmann Lyford, Craver LS and Burchenal [facility] [phone]) The use of the nitrogen mustards in the palliative treatment of carcinoma: with particular reference to bronchogenic carcinoma Cancer 1 634-56  LABORATORY DATA:  Lab Results  Component Value Date   WBC 9.4 08/18/2015   HGB 15.9 08/18/2015   HCT 46.2 08/18/2015   MCV 90.4 08/18/2015   PLT 254 08/18/2015   Lab Results  Component Value Date   NA 139 08/18/2015   K 4.3 08/18/2015   CL 110 08/18/2015   CO2 22 08/18/2015   Lab Results  Component Value Date   ALT 19 08/14/2015   AST 26 08/14/2015   ALKPHOS 52 08/14/2015   BILITOT 1.1 08/14/2015     RADIOGRAPHY: Ct Head Wo Contrast  08/13/2015  CLINICAL DATA:  Seizure today.  Frontal headache. EXAM: CT HEAD WITHOUT CONTRAST TECHNIQUE: Contiguous axial images were obtained from the base of the skull through the vertex without intravenous contrast. COMPARISON:  None. FINDINGS: Brain: There is prominent mass effect within the left frontal lobe, with effacement of the left lateral ventricle and a rightward shift of the anterior falx. There is a probable mass within the left frontal lobe which measures at least 4 x 3.7 cm on image 21 of series 201. Suspected mass extension and/or associated edema within the more inferior aspects of the left frontal lobe, with possible extension across the anterior corpus callosum. Vascular: Majority of the visible 36intracranial vessels are hyperdense suggesting pseudo-subarachnoid sign caused by surrounding brain edema. No convincing evidence of parenchymal or extra-axial hemorrhage. Skull: Negative for fracture or focal lesion. Sinuses/Orbits: No acute findings. Other: None. IMPRESSION: Probable large mass within the left frontal lobe, measuring at  least 4 x 3.7 cm on image 21 of series 201, but with suspected mass extension and/or associated edema within the more inferior aspects of the left frontal lobe. There is associated mass effect on the frontal horn of the left lateral ventricle and rightward shift of the anterior falx. Leading differential possibilities include neoplastic masses such as GBM and lymphoma. Recommend MRI with contrast for further characterization. These results were called by telephone at the time of interpretation on 08/13/2015 at 3:05 pm to Dr. Montine Circle , who verbally acknowledged these results. Electronically Signed   By: Franki Cabot M.D.   On: 08/13/2015 15:10   Mr [name] [id number] Contrast  08/18/2015  CLINICAL DATA:  Brain mass. EXAM: MRI HEAD WITHOUT AND WITH CONTRAST TECHNIQUE: Multiplanar, multiecho pulse sequences of the brain and surrounding structures were obtained without and with intravenous contrast. CONTRAST:  69mL MULTIHANCE GADOBENATE DIMEGLUMINE 529 MG/ML IV SOLN COMPARISON:  08/13/2015 FINDINGS: BrainLAB protocol performed. No acute infarct, intracranial hemorrhage, or extra-axial fluid collection. Infiltrative T2 hyperintense mass centered in the left frontal lobe has not significantly changed from the recent prior routine brain MRI. This again involves most of the left frontal lobe with extension posteriorly into the left parietal lobe and inferiorly into the temporal lobe and insular regions. There is again extension across the body and genu of the corpus callosum into the right frontal lobe. Abnormal T2 hyperintensity is present in the white matter about the occipital horns and periatrial regions bilaterally, with additional discrete small T2 hyperintensities involving the subcortical white matter of both cerebral hemispheres which are indeterminate for tumor involvement. Portions of the left frontal tumor demonstrate mild diffusion restriction, unchanged. Associated patchy areas of enhancement in the left  frontal lobe are unchanged, the largest measuring 2.3 x 0.9 x 2.2 cm posteriorly. A small region of curvilinear subcortical enhancement in the medial right frontal lobe is also unchanged. Left cerebral hemispheric mass effect is unchanged with 4 mm of rightward midline shift. Orbits are unremarkable. Mild paranasal sinus mucosal thickening is noted, predominantly in the ethmoid and maxillary sinuses. Mastoid air cells are clear. 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 Major intracranial vascular flow voids are preserved. IMPRESSION: Unchanged appearance of infiltrative, multilobar  brain mass as above. Electronically Signed   By: Logan Bores M.D.   On: 08/18/2015 14:10   Mr [name] [id number] Contrast  08/13/2015  CLINICAL DATA:  Witnessed seizure earlier today. Normal neurologic exam. EXAM: MRI HEAD WITHOUT AND WITH CONTRAST TECHNIQUE: Multiplanar, multiecho pulse sequences of the brain and surrounding structures were obtained without and with intravenous contrast. CONTRAST:  MultiHance 20 mL. COMPARISON:  CT head performed a few hours earlier. FINDINGS: Infiltrative LEFT frontal mass, mildly restricted on DWI over a 3 cm cross-sectional area. Infiltrative T2 and FLAIR hyperintensities throughout the entire frontal lobe, infiltration of the corpus callosum across the genu and body, also involving the LEFT insula, and LEFT temporal lobe as well as LEFT basal ganglia with similar abnormal signal in the RIGHT frontal lobe. Focal and confluent T2 and FLAIR hyperintensities in the RIGHT centrum semiovale and around the parieto-occipital periatrial regions, are possibly, but not clearly related. Diffuse mass effect in the LEFT frontal lobe with sulcal effacement. Mild mass effect left-to-right, 4 mm, with slight LEFT uncal herniation, and brainstem rotation. Post infusion, there are scattered foci of  enhancement, relatively minor and out of proportion to the degree of mass effect and T2 hyperintensity. The largest area of enhancement in the LEFT superior frontal cortex measures 16 x 7 x 18 mm (R-L x A-P x C-C). There is a curvilinear focus of enhancement also noted in the medial RIGHT frontal lobe (image 19 series 17). No areas of hemorrhage or central necrosis. Flow voids are maintained.  Extracranial soft tissues unremarkable. IMPRESSION: Infiltrative mass involving multiple brain regions, epicenter in the LEFT frontal lobe, with contralateral spread, mild restriction, disproportionate paucity of enhancement, and no central necrosis or hemorrhage. 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 Advanced, infiltrative, GBM cannot be excluded. Electronically Signed   By: Staci Righter M.D.   On: 08/13/2015 18:22      IMPRESSION: 35 y.o. gentleman with an unresectable left frontal glioblastoma  The patient would be a good candidate for definitive conformal radiation concurrent with Temodar.  PLAN: Today, I talked to the patient and family about the findings and work-up thus far.  We discussed the natural history of an unresectable glioblasoma and general treatment, highlighting the role of radiotherapy in the management.  We discussed the available radiation techniques, and focused on the details of logistics and delivery.  We reviewed the anticipated acute and late sequelae associated with radiation in this setting.  The patient was encouraged to ask questions that I answered to the best of my ability. The patient would like to proceed with radiation and CT simulation is scheduled this Wednesday, 09/02/15, at [facility]. The patient is scheduled to consult with palliative care at [facility] and Dr. Lindi Adie at 2:30PM today. I anticipate beginning treatment on Monday, July 10.  I spent time minutes face to face with the patient and more than 50% of that time was spent in counseling and/or coordination of care.     ------------------------------------------------  Sheral Apley. Tammi Klippel, M.D.  This document serves as a record of services personally performed by Tyler Pita, MD. It was created on his behalf by Darcus Austin, a trained medical scribe. The creation of this record is based on the scribe's personal observations and the provider's statements to them. This document has been checked and approved by the attending provider.

## 2015-08-31 NOTE — Progress Notes (Signed)
Rx for Temodar 140 mg requires PA and then sent to specialty pharmacy [facility].  PA initiated thru Cover My Meds and will follow up tomorrow.  Thank you Henreitta Leber, PharmD [facility]

## 2015-08-31 NOTE — Consult Note (Signed)
Consultation Note Date: 08/31/2015   Patient Name: Joe Butler  DOB: 10/11/1980  MRN: [id number]  Age / Sex: 35 y.o., male  PCP: No primary care provider on file. Referring Physician: Tyler Pita, MD  Reason for Consultation: Introduction of Palliative Medicine into Holistic treatment plan/psycho-social support for patient and family  HPI/Patient Profile: 35 y.o. male    CHIEF COMPLAINTS  Newly diagnosed left frontal brain tumor Glioblastoma multiforme  HISTORY OF PRESENTING ILLNESS:  Joe Butler 35 y.o. male is here because of recent diagnosis of left frontal GBM. Patient was in excellent health until he was found on the floor after having had a seizure at a [employer]. 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 He was taken to the hospital  brain MRI in [place].   The brain MRI showed a multifocal brain tumor starting in the left frontal area with infiltrative changes extending into the left temporal lobe and into the right frontal lobe infarction left basal ganglia and the entire left frontal lobe. There was mass effect and deviation of the midline. He underwent a biopsy of this mass which confirmed glioblastoma multiforme. He was seen by Dr. Vertell Limber and Dr. Tammi Klippel and will see Dr Sonny Dandy Bertell Maria today.  His father was diagnosed with stage IV lung cancer is currently undergoing active chemotherapy. His mother who is with him is trying to manage the care of both her husband and her son.   Clinical Assessment and Goals of Care:   This NP Wadie Lessen reviewed medical records, received report from team, and then meet the patient in the OP radiation-oncology clinic  along with his mother and aunt to introduce the concept of palliative medicine into a holistic treatment plan  and discuss GOCs.     Concept of  Palliative Care was discussed.  Values and goals of care important  to patient and family were attempted to be elicited.  A brief  discussion was had today regarding advanced directives.( strognly encouraged Alphonsa to document a HPOA and AD)  Concept specific to code status was had, Ila Mcgill his desire for a DNR status. (will be documented with Worthy Flank on next visit this week)      Patient was very matter of fact discussing his current medical situation.  "I'm fine, maybe I'll get angry latter but I'm fine today".  He does not ask andy questions and minimizes discussion.    His mother and aunt are very tearful.    Offered continued opportunity to meet with me for emotional support and any assistance in navigating this very difficult situation, for both Treson and his family.  Encouraged his mother to set a date/time for further counseling with this NP.   We discussed outside support opportunities: support groups, or individual counseling in the OP setting.   PMT will continue to support holistically.   Psycho-social/Spiritual:   Patient lives alone and currently seeking disability, made referral to SW in cancer to assist with financial questions.  Prognosis:  Prognosis: Per oncology note Overall survival was significantly improved in patients randomly assigned to receive radiation plus temozolomide compared with radiation alone (9.3 versus 7.6 months). Progression-free survival was also improved (5.3 versus 3.9 months). However most of these trials were done in older individuals who tend to have poor prognosis. It is difficult to determine prognosis in him because of his young age of onset. But the patient understands that he has incurable cancer. Prognosis could be less than 1 year average survival. He understands the relapses are also very common. MGMT methylation and IDH mutations were sent.      Primary Diagnoses:   GBM  I have reviewed the medical record, interviewed the patient and family, and examined the patient. The following  aspects are pertinent.  Past Medical History  Diagnosis Date  . Seizures ([place])   . Arthritis   . Asthma     as child  . Brain mass   . GBM (glioblastoma multiforme) ([place])    Social History   Social History  . Marital Status: Single    Spouse Name: N/A  . Number of Children: N/A  . Years of Education: N/A   Social History Main Topics  . Smoking status: Current Every Day Smoker -- 1.00 packs/day    Types: Cigarettes  . Smokeless tobacco: Never Used  . Alcohol Use: No     Comment: rare  . Drug Use: Yes    Special: Marijuana     Comment: pt reports daily marijuana smoker  . Sexual Activity: Yes   Other Topics Concern  . Not on file   Social History Narrative   Family History  Problem Relation Age of Onset  . Diabetes Father   . Hypertension Father   . Cancer - Lung Father    Scheduled Meds: Continuous Infusions: PRN Meds:. Medications Prior to Admission:  Prior to Admission medications   Medication Sig Start Date End Date Taking? Authorizing Provider  amphetamine-dextroamphetamine (ADDERALL) 30 MG tablet Take by mouth.    Historical Provider, MD  Desloratadine-Pseudoephedrine (CLARINEX-D 12 HOUR PO) Take 1 tablet by mouth daily as needed (allergies).    Historical Provider, MD  EPINEPHrine (EPIPEN 2-PAK) 0.3 mg/0.3 mL IJ SOAJ injection Inject into the muscle. Reported on 08/31/2015 07/14/14   Historical Provider, MD  HYDROcodone-acetaminophen (NORCO/VICODIN) 5-325 MG tablet Take 1-2 tablets by mouth every 4 (four) hours as needed for moderate pain or severe pain. 08/21/15   Erline Levine, MD  levETIRAcetam (KEPPRA) 500 MG tablet Take 1 tablet (500 mg total) by mouth 2 (two) times daily. 08/14/15   Hosie Poisson, MD  loratadine-pseudoephedrine (CLARITIN-D 24-HOUR) 10-240 MG 24 hr tablet Take by mouth.    Historical Provider, MD  LORazepam (ATIVAN) 0.5 MG tablet 30 minutes prior to radiation planning or treatments 08/31/15   Hayden Pedro, PA-C  olopatadine (PATANOL)  0.1 % ophthalmic solution PLACE 1 DROP INTO BOTH EYES 2 TIMES A DAY 07/07/15   Historical Provider, MD   Allergies  Allergen Reactions  . Bee Venom Anaphylaxis and Rash  . Shellfish Allergy Anaphylaxis  . Insect Extract Allergy Skin Test Rash and Other (See Comments)    Allergic to ants per patient  . Latex Rash  . Iodine Rash   Review of Systems  Physical Exam  Constitutional: He appears well-developed. He is cooperative.  Neurological: He is alert.  Skin: Skin is warm and dry.  - head/ surgical site suture line is clean and dry and intact  Vital Signs: There were  no vitals taken for this visit.        SpO2:   O2 Device:  O2 Flow Rate: .   IO: Intake/output summary: No intake or output data in the 24 hours ending 08/31/15 1425  LBM:   Baseline Weight:   Most recent weight:           Discussed with Worthy Flank and Joaquim Lai RN with Dr Tammi Klippel  Time In: 1330 Time Out: 4801 Time Total: 75 min Greater than 50%  of this time was spent counseling and coordinating care related to the above assessment and plan.  Signed by: Wadie Lessen, NP   Please contact Palliative Medicine Team phone at [phone] for questions and concerns.  For individual provider: See Shea Evans

## 2015-08-31 NOTE — Progress Notes (Addendum)
Location/Histology of Brain Tumor: Glioblastoma multiforme (who grade 4)    Patient presented with symptoms of:  On 08/13/2015 the patient presented to the emergency department following a witnessed seizure at work lasting approximately 3 minutes involving full body shaking. He was noted to be postictal for 20-30 minutes after the fact. Denies seizure hx.   Past or anticipated interventions, if any, per neurosurgery: left frontal craniotomy for biopsy  Past or anticipated interventions, if any, per medical oncology: scheduled to see Gudena today at 1430  Dose of Decadron, if applicable: discharged from hospital on decadron taper  Recent neurologic symptoms, if any:   Seizures: yes  Headaches: Yes, daily 8 on a scale of 0-10. Reports hydrocodone-acetaminophen "just takes the edge off."  Nausea: one episode of nausea and vomiting last week but, none since  Dizziness/ataxia: states,"if I close my eyes while I am showering I have to grab a hold of something."  Difficulty with hand coordination: no  Focal numbness/weakness: no  Visual deficits/changes: denies diplopia  Confusion/Memory deficits: mother reports a slight decline of his short term memory. Patient denies any episodes of confusion or change in short term memory  Painful bone metastases at present, if any: no  SAFETY ISSUES:  Prior radiation? no  Pacemaker/ICD? no  Possible current pregnancy? no  Is the patient on methotrexate? no  Additional Complaints / other details: 35 year old male. Tapered himself off Decadron. Reports he took his last decadron tablet on Friday, 6/23. Continues to take Keppra 500 mg bid. Vertical frontal scalp incision well approximated without drainage or edema. Faint redness noted around incision. Reports staples were removed by Dr. Vertell Limber just this morning.

## 2015-08-31 NOTE — Progress Notes (Signed)
Joe Butler CONSULT NOTE  No care team member to display  CHIEF COMPLAINTS/PURPOSE OF CONSULTATION:  Newly diagnosed left frontal brain tumor Glioblastoma multiforme  HISTORY OF PRESENTING ILLNESS:  Joe Butler 35 y.o. male is here because of recent diagnosis of left frontal GBM. Patient was in excellent health until he was found on the floor after having had a seizure at a [employer] 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 He was taken to the hospital where he has healed the head initially followed by brain MRI. The brain MRI showed a multifocal brain tumor starting in the left frontal area with infiltrative changes extending into the left temporal lobe and into the right frontal lobe infarction left basal ganglia and the entire left frontal lobe. There was mass effect and deviation of the midline. He underwent a biopsy of this mass which confirmed glioblastoma multiforme. He was seen by Dr. Vertell Limber and Dr. Tammi Klippel and is here today to discuss the role of chemotherapy with radiation.  His father was diagnosed with stage IV lung cancer is currently undergoing active chemotherapy. His mother who is with him is trying to manage the care of both her husband and her son.  I reviewed her records extensively and collaborated the history with the patient.  MEDICAL HISTORY:  Past Medical History  Diagnosis Date  . Seizures ([place])   . Arthritis   . Asthma     as child  . Brain mass   . GBM (glioblastoma multiforme) ([place])     SURGICAL HISTORY: Past Surgical History  Procedure Laterality Date  . Back surgery    . Hernia repair    . Pilonidal cyst excision    . Radiology with anesthesia N/A 08/18/2015    Procedure: MRI OF BRAIN WITH AND WITHOUT;  Surgeon: Medication Radiologist, MD;  Location: [facility];  Service: Radiology;  Laterality: N/A;  . Pr dural graft repair,spine defect Left 08/20/2015    Procedure:  Craniotomy with tumor biospy with BrainLab;   Surgeon: Erline Levine, MD;  Location: [facility];  Service: Neurosurgery;  Laterality: Left;  . Application of cranial navigation N/A 08/20/2015    Procedure: APPLICATION OF CRANIAL NAVIGATION;  Surgeon: Erline Levine, MD;  Location: [facility];  Service: Neurosurgery;  Laterality: N/A;    SOCIAL HISTORY: Social History   Social History  . Marital Status: Single    Spouse Name: N/A  . Number of Children: N/A  . Years of Education: N/A   Occupational History  . Not on file.   Social History Main Topics  . Smoking status: Current Every Day Smoker -- 1.00 packs/day    Types: Cigarettes  . Smokeless tobacco: Never Used  . Alcohol Use: No     Comment: rare  . Drug Use: Yes    Special: Marijuana     Comment: pt reports daily marijuana smoker  . Sexual Activity: Yes   Other Topics Concern  . Not on file   Social History Narrative    FAMILY HISTORY: Family History  Problem Relation Age of Onset  . Diabetes Father   . Hypertension Father   . Cancer - Lung Father     ALLERGIES:  is allergic to bee venom; shellfish allergy; insect extract allergy skin test; latex; and iodine.  MEDICATIONS:  Current Outpatient Prescriptions  Medication Sig Dispense Refill  . amphetamine-dextroamphetamine (ADDERALL) 30 MG tablet Take by mouth.    . Desloratadine-Pseudoephedrine (CLARINEX-D 12 HOUR PO) Take 1  tablet by mouth daily as needed (allergies).    . EPINEPHrine (EPIPEN 2-PAK) 0.3 mg/0.3 mL IJ SOAJ injection Inject into the muscle. Reported on 08/31/2015    . HYDROcodone-acetaminophen (NORCO/VICODIN) 5-325 MG tablet Take 1-2 tablets by mouth every 4 (four) hours as needed for moderate pain or severe pain. 60 tablet 0  . levETIRAcetam (KEPPRA) 500 MG tablet Take 1 tablet (500 mg total) by mouth 2 (two) times daily. 60 tablet 0  . loratadine-pseudoephedrine (CLARITIN-D 24-HOUR) 10-240 MG 24 hr tablet Take by mouth.    Marland Kitchen LORazepam (ATIVAN) 0.5 MG tablet 30 minutes prior to radiation  planning or treatments 90 tablet 0  . olopatadine (PATANOL) 0.1 % ophthalmic solution PLACE 1 DROP INTO BOTH EYES 2 TIMES A DAY  1   No current facility-administered medications for this visit.    REVIEW OF SYSTEMS:   Constitutional: Denies fevers, chills or abnormal night sweats Eyes: Denies blurriness of vision, double vision or watery eyes Ears, nose, mouth, throat, and face: Denies mucositis or sore throat Respiratory: Denies cough, dyspnea or wheezes Cardiovascular: Denies palpitation, chest discomfort or lower extremity swelling Gastrointestinal:  Denies nausea, heartburn or change in bowel habits Skin: Denies abnormal skin rashes Lymphatics: Denies new lymphadenopathy or easy bruising Neurological:Denies numbness, tingling or new weaknesses, patient appears to be slightly distracted and uneasy.  Behavioral/Psych: Mood is stable, no new changes   All other systems were reviewed with the patient and are negative.  PHYSICAL EXAMINATION: ECOG PERFORMANCE STATUS: 1 - Symptomatic but completely ambulatory  Filed Vitals:   08/31/15 1443  BP: 108/72  Pulse: 65  Temp: 98.4 F (36.9 C)  Resp: 18   Filed Weights   08/31/15 1443  Weight: 182 lb 1.6 oz (82.6 kg)    GENERAL:alert, no distress and comfortable SKIN: skin color, texture, turgor are normal, no rashes or significant lesions EYES: normal, conjunctiva are pink and non-injected, sclera clear OROPHARYNX:no exudate, no erythema and lips, buccal mucosa, and tongue normal  NECK: supple, thyroid normal size, non-tender, without nodularity LYMPH:  no palpable lymphadenopathy in the cervical, axillary or inguinal LUNGS: clear to auscultation and percussion with normal breathing effort HEART: regular rate & rhythm and no murmurs and no lower extremity edema ABDOMEN:abdomen soft, non-tender and normal bowel sounds Musculoskeletal:no cyanosis of digits and no clubbing  PSYCH: alert & oriented x 3 with fluent speech NEURO: no  focal motor/sensory deficits  LABORATORY DATA:  I have reviewed the data as listed Lab Results  Component Value Date   WBC 9.4 08/18/2015   HGB 15.9 08/18/2015   HCT 46.2 08/18/2015   MCV 90.4 08/18/2015   PLT 254 08/18/2015   Lab Results  Component Value Date   NA 139 08/18/2015   K 4.3 08/18/2015   CL 110 08/18/2015   CO2 22 08/18/2015    RADIOGRAPHIC STUDIES: I have personally reviewed the radiological reports and agreed with the findings in the report.  ASSESSMENT AND PLAN:  Unresectable glioblastoma of the left frontal lobe of brain ([place]) Left frontal lobe GBM: Unresectable disease. Diagnosed when the patient presented with seizures and was unconscious at the [employer] (he he transports cars for hearing).  MRI brain: 08/13/2015: Infiltrative mass involving multiple brain regions with epicenter in the left frontal lobe with contralateral spread, left frontal lobe mass 3 cm with infiltrative changes throughout entire frontal lobe, corpus callosum across the genu and body, left insula, left temporal lobe, left basal ganglion and right frontal lobe  Glioblastoma multiforme: I  discussed with them the classification of gliomas. The classification ranges from [facility] grade 1-4. WHO grade 4 glioma is glioblastoma. I discussed the pathology report and provided him with a copy of this report.  Treatment recommendation: Concurrent chemoradiation with daily temozolomide at 75 mg/m (180 mg dose) followed by maintenance Temodar 5 days every month (first month 150 mg/m, second month onwards 200 mg/m) X 12 months  Temodar counseling: I discussed the risks and benefits of temozolomide including the risk of nausea, fatigue, alopecia, diarrhea, cytopenias especially neutropenia and thrombocytopenia, risk of viral infections, risk of pneumocystis carinii night infection. Patient understands these risks and consented to proceed with treatment  Prognosis: Overall survival was improved in  patients assigned to receive radiation plus temozolomide compared with radiation alone (9.3 versus 7.6 months). Progression-free survival (5.3 versus 3.9 months). However most of these trials were done in older individuals who tend to have poor prognosis. It is difficult to determine prognosis in him because of his young age of onset. But the patient understands that he has incurable cancer. Prognosis could be less than 1 year average survival. He understands the relapses are also very common. MGMT methylation and IDH mutations were sent.   Return to clinic 2 weeks after starting temozolomide and radiation.  All questions were answered. The patient knows to call the clinic with any problems, questions or concerns.    Rulon Eisenmenger, MD 08/31/2015

## 2015-09-01 ENCOUNTER — Telehealth: Payer: Self-pay

## 2015-09-01 DIAGNOSIS — C719 Malignant neoplasm of brain, unspecified: Secondary | ICD-10-CM | POA: Insufficient documentation

## 2015-09-01 DIAGNOSIS — Z7189 Other specified counseling: Secondary | ICD-10-CM | POA: Insufficient documentation

## 2015-09-01 DIAGNOSIS — Z515 Encounter for palliative care: Secondary | ICD-10-CM | POA: Insufficient documentation

## 2015-09-01 MED ORDER — TEMOZOLOMIDE 140 MG PO CAPS: 75.0000 mg/m2/d | ORAL_CAPSULE | Freq: Every day | ORAL | Status: DC

## 2015-09-01 NOTE — Telephone Encounter (Signed)
Temodar 140 mg RX Status  .Marland Kitchen Prior Authorization approved with [facility] from 08/31/15 to 08/30/16  .Marland Kitchen RX must go thru specialty pharmacy with [facility]    .Marland Kitchen [id number] e-scribed to [facility] 09/01/15 post PA approval  .. Will follow up 09/02/15 to check RX status and copay  Thank You,  Henreitta Leber, [facility]

## 2015-09-02 ENCOUNTER — Encounter (HOSPITAL_COMMUNITY): Payer: Self-pay

## 2015-09-02 ENCOUNTER — Telehealth: Payer: Self-pay | Admitting: Pharmacist

## 2015-09-02 ENCOUNTER — Ambulatory Visit
Admission: RE | Admit: 2015-09-02 | Discharge: 2015-09-02 | Disposition: A | Discharge status: Home / Self Care | Payer: BLUE CROSS/BLUE SHIELD | Source: Ambulatory Visit | Attending: Radiation Oncology | Admitting: Radiation Oncology

## 2015-09-02 DIAGNOSIS — Z510 Encounter for antineoplastic radiation therapy: Secondary | ICD-10-CM | POA: Diagnosis not present

## 2015-09-02 DIAGNOSIS — C711 Malignant neoplasm of frontal lobe: Secondary | ICD-10-CM

## 2015-09-02 NOTE — Progress Notes (Signed)
  Radiation Oncology         [phone]) [phone] ________________________________  Name: Joe Butler MRN: [id number]  Date: 09/02/2015  DOB: 1980/12/01  SIMULATION AND TREATMENT PLANNING NOTE    ICD-9-CM ICD-10-CM   1. Unresectable glioblastoma of the left frontal lobe of brain (HCC) 191.1 C71.1     DIAGNOSIS:  35 y.o. gentleman with an unresectable left frontal glioblastoma  NARRATIVE:  The patient was brought to the [place].  Identity was confirmed.  All relevant records and images related to the planned course of therapy were reviewed.  The patient freely provided informed written consent to proceed with treatment after reviewing the details related to the planned course of therapy. The consent form was witnessed and verified by the simulation staff.  Then, the patient was set-up in a stable reproducible  supine position for radiation therapy.  CT images were obtained.  Surface markings were placed.  The CT images were loaded into the planning software.  Then the target and avoidance structures were contoured.  Treatment planning then occurred.  The radiation prescription was entered and confirmed.  Then, I designed and supervised the construction of a total of one medically necessary complex treatment device in the form of aquaplastic mask.  I have requested : Intensity Modulated Radiotherapy (IMRT) is medically necessary for this case for the following reason:  Critical CNS structure avoidance - brainstem, optic chiasm, optic nerve.  SPECIAL TREATMENT PROCEDURE:  The planned course of therapy using radiation constitutes a special treatment procedure. Special care is required in the management of this patient for the following reasons. This treatment constitutes a Special Treatment Procedure for the following reason: [ Concurrent chemotherapy (Temodar) requiring careful monitoring for increased toxicities of treatment including weekly laboratory values..  The special nature of  the planned course of radiotherapy will require increased physician supervision and oversight to ensure patient's safety with optimal treatment outcomes.  PLAN:  The patient will receive 60 Gy in 30 fractions treated edema to 44 and then boost.  ________________________________  Sheral Apley. Tammi Klippel, M.D.

## 2015-09-02 NOTE — Telephone Encounter (Signed)
I s/w pt over the phone today re: Temodar new start. He deferred counseling on Temodar.  He is in the process of researching it on his own now but we can offer re-enforcement later.  Earlier today I contacted [facility] to check RX status and they did not have the RX on file and did not see that the pt had ever filled anything at [facility].  They directed me to call insurance re: Copay.  I contacted the Pharmacy Help Desk on the back of his Insur card [phone]) and this was [employer].  They informed me the copay for pts temozolomide is $10. I s/w [facility] and informed them that a PA was approved already from 08/31/15 - 08/29/16. I s/w Marylin Crosby, pharmacist w/ [facility] and gave her the RX over the phone (even though we had already eRx'd yesterday).  They do not have 42 capsules to fill the RX today but should have full amt either tomorrow (6/29) or Fri (6/30). [facility] pharmacist stated they would be in touch w/ pt by phone to arrange delivery.  I informed pt of above and gave him the ph # to [facility] [phone]) and suggested he call them tomorrow or Friday to check the status of his RX.  We will make a call to pt next week on 09/10/15 to determine start date, tolerance, compliance & provide additional counseling if needed.  Pt is aware we will be calling him. I provided pt w/ our phone # to oral chemo clinic.  Kennith Center, Pharm.D., CPP 09/02/2015@4 :32 PM [place]

## 2015-09-04 DIAGNOSIS — Z510 Encounter for antineoplastic radiation therapy: Secondary | ICD-10-CM | POA: Diagnosis not present

## 2015-09-09 ENCOUNTER — Encounter: Payer: Self-pay | Admitting: *Deleted

## 2015-09-09 ENCOUNTER — Telehealth: Payer: Self-pay | Admitting: Pharmacist

## 2015-09-09 ENCOUNTER — Other Ambulatory Visit: Payer: Self-pay

## 2015-09-09 DIAGNOSIS — C711 Malignant neoplasm of frontal lobe: Secondary | ICD-10-CM

## 2015-09-09 MED ORDER — ONDANSETRON HCL 8 MG PO TABS
8.0000 mg | ORAL_TABLET | Freq: Three times a day (TID) | ORAL | Status: AC | PRN
Start: 2015-09-09 — End: ?

## 2015-09-09 MED ORDER — SULFAMETHOXAZOLE-TRIMETHOPRIM 800-160 MG PO TABS: 1.0000 | ORAL_TABLET | ORAL | Status: AC

## 2015-09-09 NOTE — Progress Notes (Signed)
[place] Work  Clinical Social Work was referred by nurse for assessment of psychosocial needs, request to complete ADRs/HCPOA.  Clinical Social Worker contacted patient at home to offer support and assess for needs. Pt and his mother to come to complete ADRs on 7/10 prior to his treatment. CSW to assist with HCPOA on that date and further assess for needs.      Loren Racer, [employer] Worker [place] Phone: [phone] Fax: [phone]

## 2015-09-10 ENCOUNTER — Telehealth: Payer: Self-pay

## 2015-09-10 NOTE — Telephone Encounter (Signed)
Received VM from pt's mother stating she needed ICD codes for financial assistance program she is working with.   I called Mrs. Haston back with this information and to inquire on other ways we could assist her with this process.  Pt tearful as she is concerned with how they will manage acquiring this medication financially.  Pt states she will call Temodar assistance program with this information and let us know if they need any additional information.  In the mean time I told Mrs. Vorwerk that I would touch base with our oral chemotherapy clinic to ensure they are aware of the situation and can assist however able.  Pt appreciative and verbalized understanding to call us with any further questions or concerns.  After phone call with pt's mother, discussed situation with Arbie Cookey, pharmacist in oral chemo clinic.  According to Arbie Cookey and in looking through previous notes, copay is $10 through [facility].  Arbie Cookey states she will confirm with [facility] that this is correct and follow up on shipment/etc. And then reach back out to me for continued follow up to ensure pt receives medication in timely manner as well as financial assistance as possible.

## 2015-09-10 NOTE — Telephone Encounter (Signed)
Received a call from [place], [place] in radiation oncology. She was with pt and he had questions regarding his insurance information. I gave group number, BIN, and subscriber ID as requested. Let us know if patient has further needs, questions or concerns.  Raul Del, PharmD, BCPS, [facility] [phone]

## 2015-09-11 ENCOUNTER — Telehealth: Payer: Self-pay

## 2015-09-11 ENCOUNTER — Other Ambulatory Visit: Payer: Self-pay

## 2015-09-11 DIAGNOSIS — C711 Malignant neoplasm of frontal lobe: Secondary | ICD-10-CM

## 2015-09-11 MED ORDER — TEMOZOLOMIDE 140 MG PO CAPS: 75.0000 mg/m2/d | ORAL_CAPSULE | Freq: Every day | ORAL | Status: AC

## 2015-09-11 MED ORDER — TEMOZOLOMIDE 140 MG PO CAPS: 75.0000 mg/m2/d | ORAL_CAPSULE | Freq: Every day | ORAL | Status: DC

## 2015-09-11 NOTE — Telephone Encounter (Signed)
Received notice from [place] that patient's Mother called and said they cannot afford copay for Temodar.  I looked in our notes and it said the copay was to be $10.  I contacted [facility] and they informed me his copay is $500 and to get it to $10 I needed to apply for copay assistance thru [employer].  I contacted [employer] and they do not have any funds for glioblastoma, they proceeded to give me another source [employer].  I contacted [employer] and they do not have any open funds for his diagnosis.  The last step was to contact [employer] with Patient Assistance.  I contacted Mr Salah to please come by and sign the forms, he did and we sent off on 09/10/15.  The morning of 09/11/15 I received a call from [employer] that he has been approved and that the prescription should be faxed to [employer] at [phone] (fax) and they have a phone of [phone].  This has been done with a fax confirmation as well.  I asked that it be expedited since he starts radiation on 09/14/15.  We will f/u on 7/10 to check the status with [facility].  I also confirmed all prescriptions for his maintenance Temodar are approved from [employer] we only need to fax a new prescription with the appropriate dose to [facility] when he completes radiation.  [employer] reference # [id number]  Thank you  Henreitta Leber, PharmD [facility]

## 2015-09-11 NOTE — Telephone Encounter (Signed)
New printed prescription needed for oral chemo clinic pharmacist who is assisting with financial assistance and acquiring medication for pt.  Prescription inadvertently sent to [facility] in system.  This RN attempted to contact pharmacy to disregard prescription; however, number listed in chart and online states it is not a working number.  Prescription then printed and given to [facility] in pharmacy.

## 2015-09-14 ENCOUNTER — Encounter: Payer: Self-pay | Admitting: *Deleted

## 2015-09-14 ENCOUNTER — Encounter: Payer: Self-pay | Admitting: Pharmacist

## 2015-09-14 ENCOUNTER — Ambulatory Visit
Admission: RE | Admit: 2015-09-14 | Discharge: 2015-09-14 | Disposition: A | Discharge status: Home / Self Care | Payer: BLUE CROSS/BLUE SHIELD | Source: Ambulatory Visit | Attending: Radiation Oncology | Admitting: Radiation Oncology

## 2015-09-14 DIAGNOSIS — Z510 Encounter for antineoplastic radiation therapy: Secondary | ICD-10-CM | POA: Diagnosis not present

## 2015-09-14 NOTE — Progress Notes (Signed)
Pts mother, Joe Butler came in to oral chemo clinic today and I called [facility] (Ph# [phone]).  I s/w Dorothea Ogle.  He stated pts application for [employer] pt assistance has been approved and they have marked his application "expedited" at [employer].  [employer] is sending the application to [employer] and when they receive it at [employer], they will fill Rx ASAP and send the initial shipment overnight.  This may take 24-48 hours depending on how quickly they hear from [employer].  I will call [facility] back tomorrow and check the status of the Rx.  I will clarify where the shipment will be sent (family prefers it be sent to their address rather than to our office).  I provided Joe Butler w/ counseling & an info handout on Temodar since pt deferred counseling and Joe Butler is caring for her son as much as possible.   When I hear from [facility], I will call Joe Butler and update her (ph# [phone] or [phone])  Kennith Center, Pharm.D., CPP 09/14/2015@11 :18 AM Oral Chemo Clinic

## 2015-09-14 NOTE — Progress Notes (Signed)
[place] Social Work  Clinical Social Work was referred by physician to review and complete healthcare advance directives.  Clinical Social Worker met with patient and patients mother in [place] office.  The patient designated Rickardo Brinegar as their primary healthcare agent.  Patient also completed healthcare living will.    Clinical Social Worker notarized documents and made copies for patient/family. Clinical Social Worker will send documents to medical records to be scanned into patient's chart. Clinical Social Worker encouraged patient/family to contact with any additional questions or concerns.  Johnnye Lana, MSW, LCSW, OSW-C Clinical Social Worker [facility] [phone]

## 2015-09-15 ENCOUNTER — Ambulatory Visit
Admission: RE | Admit: 2015-09-15 | Discharge: 2015-09-15 | Disposition: A | Discharge status: Home / Self Care | Payer: BLUE CROSS/BLUE SHIELD | Source: Ambulatory Visit | Attending: Radiation Oncology | Admitting: Radiation Oncology

## 2015-09-15 ENCOUNTER — Encounter: Payer: Self-pay | Admitting: *Deleted

## 2015-09-15 ENCOUNTER — Telehealth: Payer: Self-pay | Admitting: Radiation Oncology

## 2015-09-15 ENCOUNTER — Telehealth: Payer: Self-pay | Admitting: Pharmacist

## 2015-09-15 DIAGNOSIS — Z510 Encounter for antineoplastic radiation therapy: Secondary | ICD-10-CM | POA: Diagnosis not present

## 2015-09-15 NOTE — Progress Notes (Addendum)
Mr. Joe Butler and family member picked up his Temodar from nursing at 5:00pm.  He has an unknown medication from [place] that he will update [place] on this week.

## 2015-09-15 NOTE — Telephone Encounter (Signed)
Phoned patient's mother, Fraser Din. Explained [place]'s (her son) Candis Musa is on my desk. We discussed taking this medication on an empty stomach before bed. We discussed [place] setting an alarm as a reminder to take his medication. We discussed how Zofran can help manage nausea. Also, we discussed taking Zofran thirty minutes prior to temodar.

## 2015-09-15 NOTE — Telephone Encounter (Signed)
We received pts Temodar shipment from [facility] this AM. I notified pts mother, Mardene Celeste.  I will take the package down to rad onc for him to pick up there since his appt it later this afternoon and there will be limited pharmacy staff in the oral chemo clinic.  Start date of Temodar will be today (09/15/15).  Mardene Celeste is aware that if they want to have the medicine shipped to their home, they can contact [facility] but they need to let us know so we're not expecting drug to arrive here.  Kennith Center, Pharm.D., CPP 09/15/2015@12 :02 PM Oral Chemo Clinic

## 2015-09-16 ENCOUNTER — Other Ambulatory Visit: Payer: Self-pay | Admitting: Radiation Oncology

## 2015-09-16 ENCOUNTER — Ambulatory Visit
Admission: RE | Admit: 2015-09-16 | Discharge: 2015-09-16 | Disposition: A | Discharge status: Home / Self Care | Payer: BLUE CROSS/BLUE SHIELD | Source: Ambulatory Visit | Attending: Radiation Oncology | Admitting: Radiation Oncology

## 2015-09-16 ENCOUNTER — Encounter: Payer: Self-pay | Admitting: Radiation Oncology

## 2015-09-16 DIAGNOSIS — C719 Malignant neoplasm of brain, unspecified: Secondary | ICD-10-CM

## 2015-09-16 DIAGNOSIS — C711 Malignant neoplasm of frontal lobe: Secondary | ICD-10-CM

## 2015-09-16 DIAGNOSIS — G9389 Other specified disorders of brain: Secondary | ICD-10-CM

## 2015-09-16 DIAGNOSIS — Z510 Encounter for antineoplastic radiation therapy: Secondary | ICD-10-CM | POA: Diagnosis not present

## 2015-09-16 MED ORDER — DEXAMETHASONE 4 MG PO TABS
2.0000 mg | ORAL_TABLET | Freq: Two times a day (BID) | ORAL | Status: AC
Start: 2015-09-16 — End: ?

## 2015-09-16 NOTE — Progress Notes (Signed)
Patient presented to the clinic today prior to radiation treatment requesting to be evaluated by a physician. Patient reports he began taking his Temodar last night and took Zofran thirty minutes before as directed. Patient afebrile with a temperature of 98.4. Patient stopped taking decadron shorting after hospital discharge without the direction of his neurosurgeon. Now with reports of unrelieved nausea, vomiting and headache today. Patient reports he is weak because he is unable to keep any food or drink down. Patient accompanied today by his mother and aunt.   Sitting heart rate 48 and bp 115/84 Standing heart rate 51 and bp 124/72

## 2015-09-16 NOTE — Progress Notes (Signed)
Financial Counseling--patient brought in letter of support from mom--qualifies for 400[address] [place]

## 2015-09-17 ENCOUNTER — Encounter (HOSPITAL_COMMUNITY): Payer: Self-pay | Admitting: Emergency Medicine

## 2015-09-17 ENCOUNTER — Ambulatory Visit
Admission: RE | Admit: 2015-09-17 | Discharge: 2015-09-17 | Disposition: A | Discharge status: Home / Self Care | Payer: BLUE CROSS/BLUE SHIELD | Source: Ambulatory Visit | Attending: Radiation Oncology | Admitting: Radiation Oncology

## 2015-09-17 ENCOUNTER — Encounter: Payer: Self-pay | Admitting: Radiation Oncology

## 2015-09-17 ENCOUNTER — Emergency Department (HOSPITAL_COMMUNITY): Payer: BLUE CROSS/BLUE SHIELD

## 2015-09-17 ENCOUNTER — Emergency Department (HOSPITAL_COMMUNITY)
Admission: EM | Admit: 2015-09-17 | Discharge: 2015-10-06 | Disposition: E | Payer: BLUE CROSS/BLUE SHIELD | Attending: Emergency Medicine | Admitting: Emergency Medicine

## 2015-09-17 DIAGNOSIS — C711 Malignant neoplasm of frontal lobe: Secondary | ICD-10-CM | POA: Diagnosis present

## 2015-09-17 DIAGNOSIS — Z515 Encounter for palliative care: Secondary | ICD-10-CM

## 2015-09-17 DIAGNOSIS — Z8669 Personal history of other diseases of the nervous system and sense organs: Secondary | ICD-10-CM | POA: Diagnosis not present

## 2015-09-17 DIAGNOSIS — Z510 Encounter for antineoplastic radiation therapy: Secondary | ICD-10-CM | POA: Diagnosis not present

## 2015-09-17 DIAGNOSIS — Z79899 Other long term (current) drug therapy: Secondary | ICD-10-CM | POA: Diagnosis not present

## 2015-09-17 DIAGNOSIS — M1990 Unspecified osteoarthritis, unspecified site: Secondary | ICD-10-CM | POA: Diagnosis not present

## 2015-09-17 DIAGNOSIS — Z7189 Other specified counseling: Secondary | ICD-10-CM

## 2015-09-17 DIAGNOSIS — C719 Malignant neoplasm of brain, unspecified: Secondary | ICD-10-CM | POA: Insufficient documentation

## 2015-09-17 DIAGNOSIS — G935 Compression of brain: Secondary | ICD-10-CM | POA: Diagnosis not present

## 2015-09-17 DIAGNOSIS — J45909 Unspecified asthma, uncomplicated: Secondary | ICD-10-CM | POA: Insufficient documentation

## 2015-09-17 DIAGNOSIS — F17210 Nicotine dependence, cigarettes, uncomplicated: Secondary | ICD-10-CM | POA: Insufficient documentation

## 2015-09-17 DIAGNOSIS — G936 Cerebral edema: Secondary | ICD-10-CM | POA: Insufficient documentation

## 2015-09-17 DIAGNOSIS — R55 Syncope and collapse: Secondary | ICD-10-CM | POA: Diagnosis present

## 2015-09-17 DIAGNOSIS — R569 Unspecified convulsions: Secondary | ICD-10-CM

## 2015-09-17 LAB — CBC WITH DIFFERENTIAL/PLATELET
BASOS ABS: 0 10*3/uL (ref 0.0–0.1)
Basophils Relative: 0 %
Eosinophils Absolute: 0 10*3/uL (ref 0.0–0.7)
Eosinophils Relative: 0 %
HEMATOCRIT: 44 % (ref 39.0–52.0)
Hemoglobin: 15.4 g/dL (ref 13.0–17.0)
LYMPHS PCT: 10 %
Lymphs Abs: 1.4 10*3/uL (ref 0.7–4.0)
MCH: 31.7 pg (ref 26.0–34.0)
MCHC: 35 g/dL (ref 30.0–36.0)
MCV: 90.5 fL (ref 78.0–100.0)
MONO ABS: 1.5 10*3/uL — AB (ref 0.1–1.0)
Monocytes Relative: 10 %
NEUTROS ABS: 11.8 10*3/uL — AB (ref 1.7–7.7)
Neutrophils Relative %: 80 %
Platelets: 289 10*3/uL (ref 150–400)
RBC: 4.86 MIL/uL (ref 4.22–5.81)
RDW: 12 % (ref 11.5–15.5)
WBC: 14.7 10*3/uL — ABNORMAL HIGH (ref 4.0–10.5)

## 2015-09-17 LAB — I-STAT CHEM 8, ED
BUN: 15 mg/dL (ref 6–20)
CALCIUM ION: 1.17 mmol/L (ref 1.13–1.30)
CHLORIDE: 100 mmol/L — AB (ref 101–111)
Creatinine, Ser: 0.9 mg/dL (ref 0.61–1.24)
GLUCOSE: 122 mg/dL — AB (ref 65–99)
HCT: 46 % (ref 39.0–52.0)
HEMOGLOBIN: 15.6 g/dL (ref 13.0–17.0)
POTASSIUM: 3.4 mmol/L — AB (ref 3.5–5.1)
Sodium: 139 mmol/L (ref 135–145)
TCO2: 26 mmol/L (ref 0–100)

## 2015-09-17 LAB — CBG MONITORING, ED: GLUCOSE-CAPILLARY: 137 mg/dL — AB (ref 65–99)

## 2015-09-17 MED ORDER — ONDANSETRON HCL 4 MG/2ML IJ SOLN
4.0000 mg | Freq: Once | INTRAMUSCULAR | Status: AC
  Administered 2015-09-17: 4 mg via INTRAVENOUS
  Filled 2015-09-17: qty 2

## 2015-09-17 MED ORDER — SODIUM CHLORIDE 0.9 % IV BOLUS (SEPSIS)
1000.0000 mL | Freq: Once | INTRAVENOUS | Status: AC
  Administered 2015-09-17: 1000 mL via INTRAVENOUS

## 2015-09-17 NOTE — ED Notes (Signed)
Bed: [id number] Expected date:  Expected time:  Means of arrival:  Comments: EMS 35 yo male brain cancer

## 2015-09-17 NOTE — ED Notes (Signed)
Oral chemotherapy medication given per verbal order by Provider.

## 2015-09-17 NOTE — ED Notes (Signed)
Pt. i-stat Chem 8 results potassium 3.4. NP,Gail made aware.

## 2015-09-17 NOTE — ED Notes (Signed)
Provider at bedside to evaluate pt.

## 2015-09-17 NOTE — ED Provider Notes (Signed)
CSN: [id number]     Arrival date & time 10/04/2015  2114 History  By signing my name below, I, [facility], attest that this documentation has been prepared under the direction and in the presence of Junius Creamer, NP. Electronically Signed: Virgel Bouquet, ED Scribe. 09/08/2015. 11:51 PM.    Chief Complaint  Patient presents with  . Loss of Consciousness    The history is provided by the patient and a parent. No language interpreter was used.   HPI Comments: Joe Butler is a 35 y.o. male with a hx of seizures and GBM who presents to the Emergency Department complaining intermittent, moderate near-syncope ongoing for the past 2 days. Pt states that he was recently diagnosed with glioblastoma, is being treated with Decadron- started 2 days ago, radiation therapy, and oral chemotherapyl, and that over the past two days he has had nausea and emesis that prevented him from eating food or taking his medications. Mother reports that the pt has had multiple episodes of near-syncope and generalized weakness where he has had difficulty ambulating. He has taken Zofran without relief. Denies fevers, chills, paresthesias, or any other symptoms currently.  Past Medical History  Diagnosis Date  . Seizures ([place])   . Arthritis   . Asthma     as child  . Brain mass   . GBM (glioblastoma multiforme) [facility])    Past Surgical History  Procedure Laterality Date  . Back surgery    . Hernia repair    . Pilonidal cyst excision    . Radiology with anesthesia N/A 08/18/2015    Procedure: MRI OF BRAIN WITH AND WITHOUT;  Surgeon: Medication Radiologist, MD;  Location: [place];  Service: Radiology;  Laterality: N/A;  . Pr dural graft repair,spine defect Left 08/20/2015    Procedure:  Craniotomy with tumor biospy with BrainLab;  Surgeon: Erline Levine, MD;  Location: [place] NEURO ORS;  Service: Neurosurgery;  Laterality: Left;  . Application of cranial navigation N/A 08/20/2015    Procedure: APPLICATION OF  CRANIAL NAVIGATION;  Surgeon: Erline Levine, MD;  Location: [place] NEURO ORS;  Service: Neurosurgery;  Laterality: N/A;   Family History  Problem Relation Age of Onset  . Diabetes Father   . Hypertension Father   . Cancer - Lung Father    Social History  Substance Use Topics  . Smoking status: Current Every Day Smoker -- 1.00 packs/day    Types: Cigarettes  . Smokeless tobacco: Never Used  . Alcohol Use: No     Comment: rare    Review of Systems  Constitutional: Negative for fever and chills.  HENT: Negative for congestion.   Eyes: Negative for visual disturbance.  Gastrointestinal: Positive for nausea and vomiting. Negative for abdominal pain.  Musculoskeletal: Negative for myalgias, neck pain and neck stiffness.  Skin: Negative for pallor.  Neurological: Positive for syncope (near-syncope), weakness and headaches. Negative for speech difficulty and numbness.  All other systems reviewed and are negative.     Allergies  Bee venom; Shellfish allergy; Insect extract allergy skin test; Latex; and Iodine  Home Medications   Prior to Admission medications   Medication Sig Start Date End Date Taking? Authorizing Provider  amphetamine-dextroamphetamine (ADDERALL) 30 MG tablet Take 30 mg by mouth daily.    Yes Historical Provider, MD  dexamethasone (DECADRON) 4 MG tablet Take 0.5 tablets (2 mg total) by mouth 2 (two) times daily. 09/16/15  Yes Tyler Pita, MD  EPINEPHrine (EPIPEN 2-PAK) 0.3 mg/0.3 mL IJ SOAJ injection Inject into the  muscle. Reported on 08/31/2015 07/14/14  Yes Historical Provider, MD  HYDROcodone-acetaminophen (NORCO/VICODIN) 5-325 MG tablet Take 1-2 tablets by mouth every 4 (four) hours as needed for moderate pain or severe pain. 08/21/15  Yes Erline Levine, MD  levETIRAcetam (KEPPRA) 500 MG tablet Take 1 tablet (500 mg total) by mouth 2 (two) times daily. 08/14/15  Yes Hosie Poisson, MD  LORazepam (ATIVAN) 0.5 MG tablet 30 minutes prior to radiation planning or treatments  08/31/15  Yes Hayden Pedro, PA-C  olopatadine (PATANOL) 0.1 % ophthalmic solution PLACE 1 DROP INTO BOTH EYES 2 TIMES A DAY 07/07/15  Yes Historical Provider, MD  ondansetron (ZOFRAN) 8 MG tablet Take 1 tablet (8 mg total) by mouth every 8 (eight) hours as needed for nausea or vomiting. 09/09/15  Yes Nicholas Lose, MD  temozolomide (TEMODAR) 140 MG capsule Take 1 capsule (140 mg total) by mouth daily. May take on an empty stomach or at bedtime to decrease nausea & vomiting. 09/11/15  Yes Nicholas Lose, MD  sulfamethoxazole-trimethoprim (BACTRIM DS,SEPTRA DS) 800-160 MG tablet Take 1 tablet by mouth 3 (three) times a week. On Monday Wednesday and Friday Patient not taking: Reported on 09/30/2015 09/09/15   Nicholas Lose, MD   BP 200/142 mmHg  Pulse 177  Temp(Src) 98.7 F (37.1 C) (Oral)  Resp 0  Ht 6' (1.829 m)  Wt 81.647 kg  BMI 24.41 kg/m2  SpO2 85% Physical Exam  Constitutional: He is oriented to person, place, and time. He appears well-developed and well-nourished. No distress.  HENT:  Head: Normocephalic and atraumatic.  Mouth/Throat: Oropharynx is clear and moist.  Eyes: Pupils are equal, round, and reactive to light.  Neck: Normal range of motion.  Pulmonary/Chest: Effort normal and breath sounds normal. No respiratory distress.  Abdominal: Soft. He exhibits no distension. There is no tenderness.  Musculoskeletal: Normal range of motion.  Neurological: He is alert and oriented to person, place, and time. He displays normal reflexes. He exhibits normal muscle tone.  Skin: Skin is warm and dry.  Psychiatric: He has a normal mood and affect. His behavior is normal.  Nursing note and vitals reviewed.   ED Course  Procedures (including critical care time)  DIAGNOSTIC STUDIES: Oxygen Saturation is 99% on RA, normal by my interpretation.    COORDINATION OF CARE: 10:06 PM Will order Zofran, IV fluids, labs, and a head CT. Discussed treatment plan with pt at bedside and pt agreed to  plan.   Labs Review Labs Reviewed  CBC WITH DIFFERENTIAL/PLATELET - Abnormal; Notable for the following:    WBC 14.7 (*)    Neutro Abs 11.8 (*)    Monocytes Absolute 1.5 (*)    All other components within normal limits  CBG MONITORING, ED - Abnormal; Notable for the following:    Glucose-Capillary 137 (*)    All other components within normal limits  I-STAT CHEM 8, ED - Abnormal; Notable for the following:    Potassium 3.4 (*)    Chloride 100 (*)    Glucose, Bld 122 (*)    All other components within normal limits    Imaging Review Ct Head Wo Contrast  Sep 27, 2015  CLINICAL DATA:  35 year old male with near syncope. History of brain mass. EXAM: CT HEAD WITHOUT CONTRAST TECHNIQUE: Contiguous axial images were obtained from the base of the skull through the vertex without intravenous contrast. COMPARISON:  Head CT dated 08/13/2015 an MRI dated 08/18/2015 FINDINGS: Evaluation of this exam is limited due to motion artifact. Evaluation is also  limited in the absence of intravenous contrast. There is an area of edema in the left frontal lobe with associated mass effect and compression of the left lateral ventricle. There is increase in the amount of edema and mass effect and approximately 8 mm left-to-right midline shift (measured at the level of the foramen Monro). An ill-defined 3.1 x 3.1 cm high attenuating area in the left frontal lobe (series 5, image 12) corresponds to the mass seen on the prior CT and MRI and appears grossly similar. There is no definite acute intracranial hemorrhage. There is effacement of the quadrigeminal plate cistern concerning for impending transtentorial herniation. There is effacement of the fourth ventricle. There is slight interval increase in the size of the right lateral ventricle compared to the prior study. Left frontal craniotomy changes noted. The visualized paranasal sinuses and mastoid air cells are clear. IMPRESSION: Interval increase in the edema in the left  frontal lobe with increase in mass effect and approximately 8 mm left-to-right midline shift. Left frontal high attenuating mass appears grossly stable since the prior study. Effacement of the quadrigeminal plate cistern and fourth ventricle. Mild dilatation of the right lateral ventricle compared to the prior study. No acute intracranial hemorrhage. Neurosurgical consult is advised. These results were called by telephone at the time of interpretation on 07-Oct-2015 at 12:31 am to nurse practitioner Junius Creamer , who verbally acknowledged these results. Electronically Signed   By: Anner Crete M.D.   On: 10/07/15 00:37   I have personally reviewed and evaluated these images and lab results as part of my medical decision-making.   EKG Interpretation   Date/Time:  Thursday September 17 2015 21:23:36 EDT Ventricular Rate:  75 PR Interval:    QRS Duration: 89 QT Interval:  392 QTC Calculation: 438 R Axis:   63 Text Interpretation:  Sinus rhythm Atrial premature complex Since last  tracing rate slower Confirmed by WENTZ  MD, ELLIOTT [id number]) on Oct 07, 2015  12:13:16 PM     Initially given IV fluids, IV Zofran.  He will be allowed to take his chemotherapy pill which is scheduled medication box 2010 minutes after the Zofran.  I will obtain repeat CT scan to assess for edema as well and checking CBC i-STAT Radiologist called informing me that he has worsening edema with mass effect compression of the lateral left ventricle left-to-right midline shift has increased considerably patient is at risk for herniation. I did speak with his mother and aunt outside the room.  He states that they have discussed CODE STATUS and he is a DO NOT RESUSCITATE. This has been discussed with palliative care, and all are in agreement Due to patient's confusion.  He will be admitted to the hospital for palliative care. Patient had acute change in mental status, hypertensive and tachycardic.  Chaplain has been called to the  bedside Patient expired with family at bedside MDM   Final diagnoses:  GBM (glioblastoma multiforme) ([place])  Cerebral edema ([place])    I personally performed the services described in this documentation, which was scribed in my presence. The recorded information has been reviewed and is accurate.   Junius Creamer, NP 2015/10/07 0136  Junius Creamer, NP 10-07-15 [place], MD 09/19/15 [phone]

## 2015-09-17 NOTE — ED Notes (Signed)
Pt is coming from home by [facility]. Mother reported pt having multiple episodes of syncope lasting for several seconds each time. Mother stated they were going outside and she had to catch the pt to keep him from falling from passing out. He started chemotherapy and radiation on 09/14/15. No c/o n/v today. EMS reports poor oral intake and no complaints of pain.

## 2015-09-17 NOTE — ED Notes (Signed)
Pt mother at bedside with home medications. Advised to wait for MD regarding medication administration.

## 2015-09-18 ENCOUNTER — Ambulatory Visit: Payer: BLUE CROSS/BLUE SHIELD

## 2015-09-18 ENCOUNTER — Encounter: Payer: Self-pay | Admitting: Radiation Oncology

## 2015-09-18 DIAGNOSIS — G935 Compression of brain: Secondary | ICD-10-CM

## 2015-09-18 DIAGNOSIS — Z7189 Other specified counseling: Secondary | ICD-10-CM | POA: Diagnosis not present

## 2015-09-18 DIAGNOSIS — C711 Malignant neoplasm of frontal lobe: Secondary | ICD-10-CM | POA: Diagnosis not present

## 2015-09-18 DIAGNOSIS — Z515 Encounter for palliative care: Secondary | ICD-10-CM | POA: Diagnosis not present

## 2015-09-18 MED ORDER — HYDROCODONE-ACETAMINOPHEN 5-325 MG PO TABS: 1.0000 | ORAL_TABLET | ORAL | Status: DC | PRN

## 2015-09-18 MED ORDER — ONDANSETRON HCL 4 MG PO TABS: 8.0000 mg | ORAL_TABLET | Freq: Three times a day (TID) | ORAL | Status: DC | PRN

## 2015-09-18 MED ORDER — MORPHINE SULFATE 25 MG/ML IV SOLN
1.0000 mg/h | INTRAVENOUS | Status: DC
  Administered 2015-09-18: 1 mg/h via INTRAVENOUS
  Filled 2015-09-18: qty 10

## 2015-09-18 MED ORDER — GLYCOPYRROLATE 0.2 MG/ML IJ SOLN: 0.2000 mg | INTRAMUSCULAR | Status: DC | PRN

## 2015-09-18 MED ORDER — MORPHINE SULFATE (PF) 4 MG/ML IV SOLN
4.0000 mg | Freq: Once | INTRAVENOUS | Status: AC
  Administered 2015-09-18: 4 mg via INTRAVENOUS
  Filled 2015-09-18: qty 1

## 2015-09-18 MED ORDER — LORAZEPAM 2 MG/ML IJ SOLN
INTRAMUSCULAR | Status: AC
  Filled 2015-09-18: qty 1

## 2015-09-18 MED ORDER — LORAZEPAM 2 MG/ML IJ SOLN
2.0000 mg | Freq: Once | INTRAMUSCULAR | Status: AC
  Administered 2015-09-18: 2 mg via INTRAVENOUS

## 2015-09-18 MED ORDER — ONDANSETRON HCL 4 MG/2ML IJ SOLN: 4.0000 mg | Freq: Four times a day (QID) | INTRAMUSCULAR | Status: DC | PRN

## 2015-09-18 MED ORDER — OLOPATADINE HCL 0.1 % OP SOLN: 1.0000 [drp] | Freq: Two times a day (BID) | OPHTHALMIC | Status: DC

## 2015-09-18 MED ORDER — GLYCOPYRROLATE 1 MG PO TABS: 1.0000 mg | ORAL_TABLET | ORAL | Status: DC | PRN

## 2015-09-18 MED ORDER — LEVETIRACETAM 500 MG PO TABS
500.0000 mg | ORAL_TABLET | Freq: Two times a day (BID) | ORAL | Status: DC
  Filled 2015-09-18: qty 1

## 2015-09-18 MED ORDER — DEXAMETHASONE SODIUM PHOSPHATE 10 MG/ML IJ SOLN
10.0000 mg | Freq: Once | INTRAMUSCULAR | Status: AC
  Administered 2015-09-18: 10 mg via INTRAVENOUS
  Filled 2015-09-18: qty 1

## 2015-09-18 MED ORDER — HYDROMORPHONE HCL 1 MG/ML IJ SOLN
1.0000 mg | Freq: Once | INTRAMUSCULAR | Status: AC
  Administered 2015-09-18: 1 mg via INTRAVENOUS
  Filled 2015-09-18: qty 1

## 2015-09-18 MED ORDER — DEXAMETHASONE SODIUM PHOSPHATE 4 MG/ML IJ SOLN: 4.0000 mg | Freq: Four times a day (QID) | INTRAMUSCULAR | Status: DC

## 2015-09-18 MED ORDER — MORPHINE BOLUS VIA INFUSION
1.0000 mg | INTRAVENOUS | Status: DC | PRN
  Filled 2015-09-18: qty 4

## 2015-09-18 MED ORDER — SODIUM CHLORIDE 0.9 % IV SOLN: 500.0000 mg | Freq: Two times a day (BID) | INTRAVENOUS | Status: DC

## 2015-09-18 MED ORDER — HYDROMORPHONE HCL 1 MG/ML IJ SOLN: 0.5000 mg | INTRAMUSCULAR | Status: DC | PRN

## 2015-09-18 MED ORDER — DEXAMETHASONE SODIUM PHOSPHATE 10 MG/ML IJ SOLN: 10.0000 mg | Freq: Four times a day (QID) | INTRAMUSCULAR | Status: DC

## 2015-09-21 ENCOUNTER — Ambulatory Visit: Payer: BLUE CROSS/BLUE SHIELD

## 2015-09-22 ENCOUNTER — Ambulatory Visit: Payer: BLUE CROSS/BLUE SHIELD

## 2015-09-23 ENCOUNTER — Ambulatory Visit: Payer: BLUE CROSS/BLUE SHIELD

## 2015-09-24 ENCOUNTER — Ambulatory Visit: Admission: RE | Admit: 2015-09-24 | Payer: BLUE CROSS/BLUE SHIELD | Source: Ambulatory Visit

## 2015-09-24 NOTE — Progress Notes (Signed)
  Radiation Oncology         [phone] ________________________________  Name: Joe Butler MRN: [id number]  Date: 09/16/2015  DOB: 10-29-1980  End of Treatment Note  Diagnosis: 35 y.o. gentleman with an unresectable left frontal glioblastoma  Indication for treatment:  Local Control       Radiation treatment dates:  09/14/15 - 09/25/2015  Site/dose:    1. The initial enhancing tumor, peritumoral edema, plus 1 cm were treated to 8 Gy in 4 fractions.  Beams/energy:  1. The initial enhancing tumor, peritumoral edema, plus 1 cm were treated using helical intensity modulated radiotherapy delivering 6 megavolt photons. Image guidance was performed with megavoltage CT studies prior to each fraction. He was immobilized with a thermoplastic mask.  Narrative: The patient presented to the ED on 10/01/2015 for multiple episodes of syncope. He also had a 2 day history of nausea and emesis preventing him from eating food or taking medications. CT scan showed severe edema and impending transtentorial herniation. Unfortunately, the patient experienced a brain herniation and passed on at 2:35AM the morning of 09-30-2015.   ________________________________  Sheral Apley. Tammi Klippel, M.D.  This document serves as a record of services personally performed by Tyler Pita, MD. It was created on his behalf by Darcus Austin, a trained medical scribe. The creation of this record is based on the scribe's personal observations and the provider's statements to them. This document has been checked and approved by the attending provider.

## 2015-09-25 ENCOUNTER — Ambulatory Visit: Payer: BLUE CROSS/BLUE SHIELD

## 2015-09-28 ENCOUNTER — Other Ambulatory Visit: Payer: BLUE CROSS/BLUE SHIELD

## 2015-09-28 ENCOUNTER — Ambulatory Visit: Payer: BLUE CROSS/BLUE SHIELD | Admitting: Hematology and Oncology

## 2015-09-28 ENCOUNTER — Ambulatory Visit: Payer: BLUE CROSS/BLUE SHIELD

## 2015-09-29 ENCOUNTER — Ambulatory Visit: Payer: BLUE CROSS/BLUE SHIELD

## 2015-09-30 ENCOUNTER — Ambulatory Visit: Payer: BLUE CROSS/BLUE SHIELD

## 2015-10-01 ENCOUNTER — Ambulatory Visit: Payer: BLUE CROSS/BLUE SHIELD

## 2015-10-02 ENCOUNTER — Ambulatory Visit: Payer: BLUE CROSS/BLUE SHIELD

## 2015-10-05 ENCOUNTER — Ambulatory Visit: Admission: RE | Admit: 2015-10-05 | Payer: BLUE CROSS/BLUE SHIELD | Source: Ambulatory Visit

## 2015-10-06 ENCOUNTER — Ambulatory Visit: Payer: BLUE CROSS/BLUE SHIELD

## 2015-10-06 NOTE — ED Notes (Signed)
Family at bedside at this time

## 2015-10-06 NOTE — ED Notes (Signed)
Organ recovery Technician Amanda at bedside to speak with pt family about procurement.

## 2015-10-06 NOTE — ED Notes (Signed)
Hospitalist at bedside to evaluate pt.

## 2015-10-06 NOTE — ED Notes (Signed)
Pt found with snoring respirations and pupils fixed. And hospitalist at bedside. Ativan order given.

## 2015-10-06 NOTE — ED Notes (Signed)
Family at bedside and hospitalist and ED physician discussed plan of care and pt wishes in regards to no CPR or intubation.

## 2015-10-06 NOTE — ED Notes (Signed)
Organ procurement tech completed with procedure.

## 2015-10-06 NOTE — Consult Note (Addendum)
History and Physical    Joe Butler [id number] DOB: 01-22-1981 DOA: 09/05/2015   PCP: No primary care provider on file. Chief Complaint:  Chief Complaint  Patient presents with  . Loss of Consciousness    HPI: Joe Butler is a 35 y.o. 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  He has undergone PO chemo and had a couple of radiation treatments.  His mental status has rapidly declined over the past couple of days and especially this evening.  Patient brought in by family.  Patient unable to provide history.  ED Course: CT head shows severe edema and that he is about to herniate!  Dr. Annette Stable says go ahead and put him on decadron but he dosent think that it will make a difference.  Dosent have anything to offer surgically.  Patient to be admitted for palliative care consult and hospice consult in AM.  Had long discussion with family about goals of care.  Patient has been made clear he does not want to be put on life support and kept alive.  Review of Systems: Unable to perform due to AMS.   Past Medical History  Diagnosis Date  . Seizures ([place])   . Arthritis   . Asthma     as child  . Brain mass   . GBM (glioblastoma multiforme) [facility])     Past Surgical History  Procedure Laterality Date  . Back surgery    . Hernia repair    . Pilonidal cyst excision    . Radiology with anesthesia N/A 08/18/2015    Procedure: MRI OF BRAIN WITH AND WITHOUT;  Surgeon: Medication Radiologist, MD;  Location: [place];  Service: Radiology;  Laterality: N/A;  . Pr dural graft repair,spine defect Left 08/20/2015    Procedure:  Craniotomy with tumor biospy with BrainLab;  Surgeon: Erline Levine, MD;  Location: [place] [facility]UR[facility];  Service: Neurosurgery;  Laterality: Left;  . Application of cranial navigation N/A 08/20/2015    Procedure: APPLICATION OF CRANIAL NAVIGATION;  Surgeon: Erline Levine, MD;  Location: [place] [facility]UR[facility];  Service: Neurosurgery;  Laterality:  N/A;     reports that he has been smoking Cigarettes.  He has been smoking about 1.00 pack per day. He has never used smokeless tobacco. He reports that he uses illicit drugs (Marijuana). He reports that he does not drink alcohol.  Allergies  Allergen Reactions  . Bee Venom Anaphylaxis and Rash  . Shellfish Allergy Anaphylaxis  . Insect Extract Allergy Skin Test Rash and Other (See Comments)    Allergic to ants per patient  . Latex Rash  . Iodine Rash    Family History  Problem Relation Age of Onset  . Diabetes Father   . Hypertension Father   . Cancer - Lung Father       Prior to Admission medications   Medication Sig Start Date End Date Taking? Authorizing Provider  amphetamine-dextroamphetamine (ADDERALL) 30 MG tablet Take 30 mg by mouth daily.    Yes Historical Provider, MD  dexamethasone (DECADRON) 4 MG tablet Take 0.5 tablets (2 mg total) by mouth 2 (two) times daily. 09/16/15  Yes Tyler Pita, MD  EPINEPHrine (EPIPEN 2-PAK) 0.3 mg/0.3 mL IJ SOAJ injection Inject into the muscle. Reported on 08/31/2015 07/14/14  Yes Historical Provider, MD  HYDROcodone-acetaminophen (NORCO/VICODIN) 5-325 MG tablet Take 1-2 tablets by mouth every 4 (four) hours as needed for moderate pain or severe pain. 08/21/15  Yes Erline Levine, MD  levETIRAcetam ([place])  500 MG tablet Take 1 tablet (500 mg total) by mouth 2 (two) times daily. 08/14/15  Yes Hosie Poisson, MD  LORazepam (ATIVAN) 0.5 MG tablet 30 minutes prior to radiation planning or treatments 08/31/15  Yes Hayden Pedro, PA-C  olopatadine (PATANOL) 0.1 % ophthalmic solution PLACE 1 DROP INTO BOTH EYES 2 TIMES A DAY 07/07/15  Yes Historical Provider, MD  ondansetron (ZOFRAN) 8 MG tablet Take 1 tablet (8 mg total) by mouth every 8 (eight) hours as needed for nausea or vomiting. 09/09/15  Yes Nicholas Lose, MD  temozolomide (TEMODAR) 140 MG capsule Take 1 capsule (140 mg total) by mouth daily. May take on an empty stomach or at bedtime to  decrease nausea & vomiting. 09/11/15  Yes Nicholas Lose, MD  sulfamethoxazole-trimethoprim (BACTRIM DS,SEPTRA DS) 800-160 MG tablet Take 1 tablet by mouth 3 (three) times a week. On Monday Wednesday and Friday Patient not taking: Reported on 09/30/2015 09/09/15   Nicholas Lose, MD    Physical Exam: Filed Vitals:   10/03/2015 2123 09/19/2015 2326 10-16-2015 0117 10-16-15 0130  BP: 153/94 120/87 144/81 143/94  Pulse: 66 70 81 79  Temp: 98.6 F (37 C)  98.7 F (37.1 C)   TempSrc: Oral  Oral   Resp: 17 19 19 17   Height: 6' (1.829 m)     Weight: 81.647 kg (180 lb)     SpO2: 99% 98% 99% 95%      Constitutional: Respiratory distress, sonurous respirations, actively occluding airway Eyes: Pupils are fixed and dilated. ENMT: Mucous membranes are moist. Posterior pharynx clear of any exudate or lesions.Normal dentition.  Neck: normal, supple, no masses, no thyromegaly Respiratory: sonorous respirations and respiratory distress due to occluding airway  Cardiovascular: Severe tachycardia Abdomen: no tenderness, no masses palpated. No hepatosplenomegaly. Bowel sounds positive.  Musculoskeletal: no clubbing / cyanosis. No joint deformity upper and lower extremities. Good ROM, no contractures. Normal muscle tone.  Skin: no rashes, lesions, ulcers. No induration Neurologic: Respiratory distress, occluding airway, pupils fixed and dilated. Psychiatric: Unable to perform   Labs on Admission: I have personally reviewed following labs and imaging studies  CBC:  Recent Labs Lab 09/06/2015 2235 09/21/2015 2238  WBC 14.7*  --   NEUTROABS 11.8*  --   HGB 15.4 15.6  HCT 44.0 46.0  MCV 90.5  --   PLT 289  --    Basic Metabolic Panel:  Recent Labs Lab 09/26/2015 2238  NA 139  K 3.4*  CL 100*  GLUCOSE 122*  BUN 15  CREATININE 0.90   GFR: Estimated Creatinine Clearance: 124.5 mL/min (by C-G formula based on Cr of 0.9). Liver Function Tests: No results for input(s): AST, ALT, ALKPHOS, BILITOT, PROT,  ALBUMIN in the last 168 hours. No results for input(s): LIPASE, AMYLASE in the last 168 hours. No results for input(s): AMMONIA in the last 168 hours. Coagulation Profile: No results for input(s): INR, PROTIME in the last 168 hours. Cardiac Enzymes: No results for input(s): CKTOTAL, CKMB, CKMBINDEX, TROPONINI in the last 168 hours. BNP (last 3 results) No results for input(s): PROBNP in the last 8760 hours. HbA1C: No results for input(s): HGBA1C in the last 72 hours. CBG:  Recent Labs Lab 09/17/15 2126  GLUCAP 137*   Lipid Profile: No results for input(s): CHOL, HDL, LDLCALC, TRIG, CHOLHDL, LDLDIRECT in the last 72 hours. Thyroid Function Tests: No results for input(s): TSH, T4TOTAL, FREET4, T3FREE, THYROIDAB in the last 72 hours. Anemia Panel: No results for input(s): VITAMINB12, FOLATE, FERRITIN, TIBC, IRON,  RETICCTPCT in the last 72 hours. Urine analysis:    Component Value Date/Time   COLORURINE YELLOW 08/13/2015 1925   APPEARANCEUR CLEAR 08/13/2015 1925   LABSPEC 1.014 08/13/2015 1925   PHURINE 5.5 08/13/2015 1925   GLUCOSEU NEGATIVE 08/13/2015 1925   HGBUR NEGATIVE 08/13/2015 1925   BILIRUBINUR NEGATIVE 08/13/2015 1925   KETONESUR NEGATIVE 08/13/2015 1925   PROTEINUR NEGATIVE 08/13/2015 1925   NITRITE NEGATIVE 08/13/2015 1925   LEUKOCYTESUR NEGATIVE 08/13/2015 1925   Sepsis Labs: @LABRCNTIP (procalcitonin:4,lacticidven:4) )No results found for this or any previous visit (from the past 240 hour(s)).   Radiological Exams on Admission: Ct Head Wo Contrast  10/12/15  CLINICAL DATA:  35 year old male with near syncope. History of brain mass. EXAM: CT HEAD WITHOUT CONTRAST TECHNIQUE: Contiguous axial images were obtained from the base of the skull through the vertex without intravenous contrast. COMPARISON:  Head CT dated 08/13/2015 an MRI dated 08/18/2015 FINDINGS: Evaluation of this exam is limited due to motion artifact. Evaluation is also limited in the absence of  intravenous contrast. There is an area of edema in the left frontal lobe with associated mass effect and compression of the left lateral ventricle. There is increase in the amount of edema and mass effect and approximately 8 mm left-to-right midline shift (measured at the level of the foramen Monro). An ill-defined 3.1 x 3.1 cm high attenuating area in the left frontal lobe (series 5, image 12) corresponds to the mass seen on the prior CT and MRI and appears grossly similar. There is no definite acute intracranial hemorrhage. There is effacement of the quadrigeminal plate cistern concerning for impending transtentorial herniation. There is effacement of the fourth ventricle. There is slight interval increase in the size of the right lateral ventricle compared to the prior study. Left frontal craniotomy changes noted. The visualized paranasal sinuses and mastoid air cells are clear. IMPRESSION: Interval increase in the edema in the left frontal lobe with increase in mass effect and approximately 8 mm left-to-right midline shift. Left frontal high attenuating mass appears grossly stable since the prior study. Effacement of the quadrigeminal plate cistern and fourth ventricle. Mild dilatation of the right lateral ventricle compared to the prior study. No acute intracranial hemorrhage. Neurosurgical consult is advised. These results were called by telephone at the time of interpretation on 10-12-15 at 12:31 am to nurse practitioner Junius Creamer , who verbally acknowledged these results. Electronically Signed   By: Anner Crete M.D.   On: 2015/10/12 00:37    EKG: Independently reviewed.  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  BP which had been 0000000 systolic jumps up to the [id number].  He is put on NRB and is satting 84% on NRB with the tachycardia, sonurous respirations, and hypertension still present.  Exam as above.  Although seizure seems less likely did elect as a last ditch effort to give him 2mg  ativan before putting on morphine gtt.  2mg  ativan didn't really do a whole lot.  I believe the patient is actively having herniation at this point.  Family has re-affirmed the patients  previously expressed wishes for DNI/DNR and not to do massive brain surgery (which it dosent sound like Dr. Trenton Gammon was offering anyhow), especially as this would not at all change the final outcome of an unresectable GBM.  I have reassured them that this is absolutely the correct decision on their part.  I put the comfort measures order set  Literally as I was typing the line above this I am informed that his pulse has now dropped suddenly to 35.  BP in 200s, respirations are abnormal. This clinical picture is now very consistent and classic for [place]'s [facility]ad.    Patient passed on at 2:35AM.    Explained to family the science behind brain herniation, and what we saw clinically this evening leading up to his death.  I let them know that if they had any doubts they could certainly request autopsy, but that I am fairly confident that this is what happened.  Family has declined autopsy at this time.  Provided them with my name, service, and hospital number for contact information.  Cause of death on the Death Certificate will be Transtentorial herniation secondary to vasogenic edema secondary to GBM.   DVT prophylaxis: None Code Status: DNR/DNI Family Communication: Family at bedside Consults called: Neurosurgery called by EDP Admission status: Patient deceased   Etta Quill  DO [facility] Pager [phone] from 7PM-7AM  If 7AM-7PM, please contact the day physician for the patient [email or web address] Password [email or web address]  10-Oct-2015, 1:57 AM

## 2015-10-06 NOTE — ED Notes (Addendum)
Dr.Gardner at bedside along with chaplin to speak with family. Time of death called at this time.

## 2015-10-06 NOTE — Progress Notes (Addendum)
[place] arrived to meet pt's mother and aunt who were bedside. They were very tearful as they were coming to terms w/pt actively dying. Family was notified pt passed about 10 minutes after [place] arrival. Mother was overcome w/grief; appropriately sobbing w/her sister. She said her husband and brother were on the way. Pt's mother explained that her husband also has a brain tumor and lung cancer and has two more chemo treatments. [place] also learned that pt had just been diagnosed w/brain tumor in (mid) June. When her husband arrived she told him their son was gone. He and pts' brother were very tearful. The family was overcome w/grief as others arrived during the next 2-3 hours. [place] provided prayer, comfort, hydration and grief support. The family was given the number to bed control to call when they have made a decision regarding a funeral service provider. They were given a list of funeral homes.  Chaplain Ernest Haber, M.Div.   October 08, 2015 0500  Clinical Encounter Type  Visited With Family

## 2015-10-06 NOTE — ED Notes (Addendum)
249mg  of Morphine from IV Drip wasted in sink witnessed by Nilsa Nutting, RN

## 2015-10-06 DEATH — deceased

## 2015-10-07 ENCOUNTER — Ambulatory Visit: Payer: BLUE CROSS/BLUE SHIELD

## 2015-10-08 ENCOUNTER — Ambulatory Visit: Payer: BLUE CROSS/BLUE SHIELD

## 2015-10-09 ENCOUNTER — Ambulatory Visit: Payer: BLUE CROSS/BLUE SHIELD

## 2015-10-12 ENCOUNTER — Ambulatory Visit: Payer: BLUE CROSS/BLUE SHIELD

## 2015-10-13 ENCOUNTER — Ambulatory Visit: Payer: BLUE CROSS/BLUE SHIELD

## 2015-10-14 ENCOUNTER — Ambulatory Visit: Payer: BLUE CROSS/BLUE SHIELD

## 2015-10-15 ENCOUNTER — Ambulatory Visit: Payer: BLUE CROSS/BLUE SHIELD

## 2015-10-16 ENCOUNTER — Ambulatory Visit: Payer: BLUE CROSS/BLUE SHIELD

## 2015-10-19 ENCOUNTER — Ambulatory Visit: Payer: BLUE CROSS/BLUE SHIELD

## 2015-10-20 ENCOUNTER — Ambulatory Visit: Payer: BLUE CROSS/BLUE SHIELD

## 2015-10-21 ENCOUNTER — Ambulatory Visit: Payer: BLUE CROSS/BLUE SHIELD

## 2015-10-22 ENCOUNTER — Ambulatory Visit: Payer: BLUE CROSS/BLUE SHIELD

## 2015-10-23 ENCOUNTER — Ambulatory Visit: Payer: BLUE CROSS/BLUE SHIELD

## 2018-04-06 IMAGING — CT CT HEAD W/O CM
4 series · 17 of 47 positions shown, 19 images · non-contrast
Comparison: None.

CLINICAL DATA: Seizure today.  Frontal headache.

EXAM:
CT HEAD WITHOUT CONTRAST
TECHNIQUE: Contiguous axial images were obtained from the base of the skull
through the vertex without intravenous contrast.

[Series 201: head w/o, idose (1) · axial · non-contrast · 0.49mm/px · z∈[+112,+232]mm · 8 of 32 slices shown, 10 images]
[im 4/32  brain]
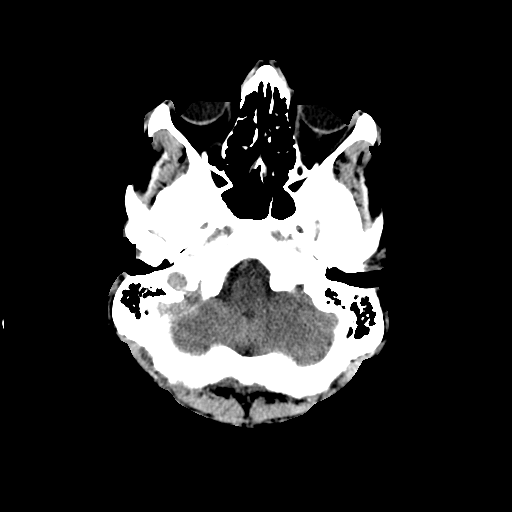
[im 4/32  bone]
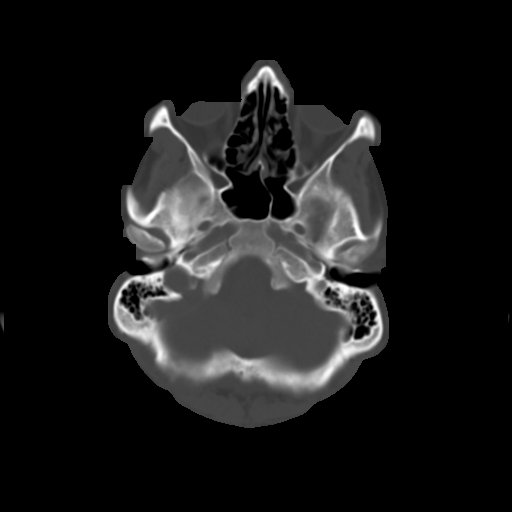
[im 7/32  brain]
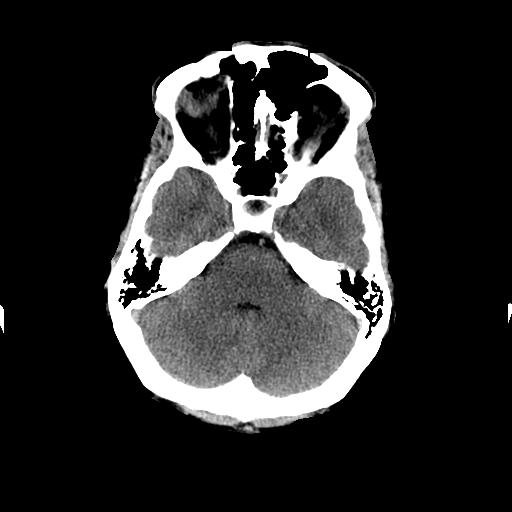
[im 11/32  brain]
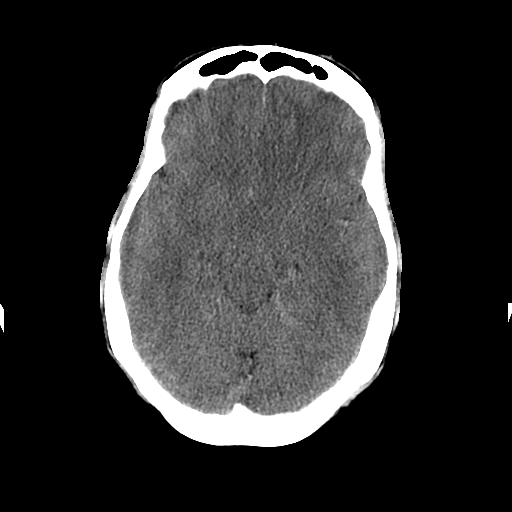
[im 14/32  brain]
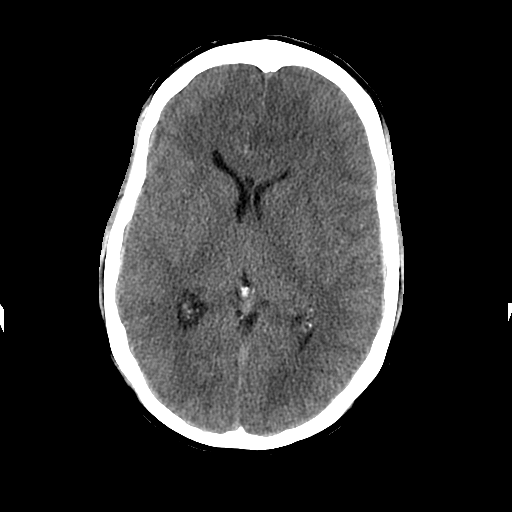
[im 18/32  brain]
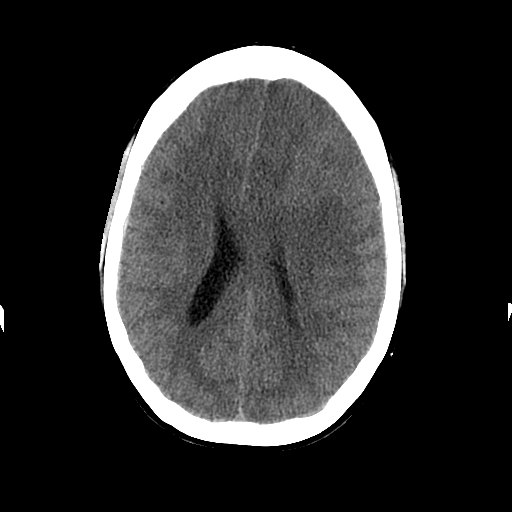
[im 18/32  bone]
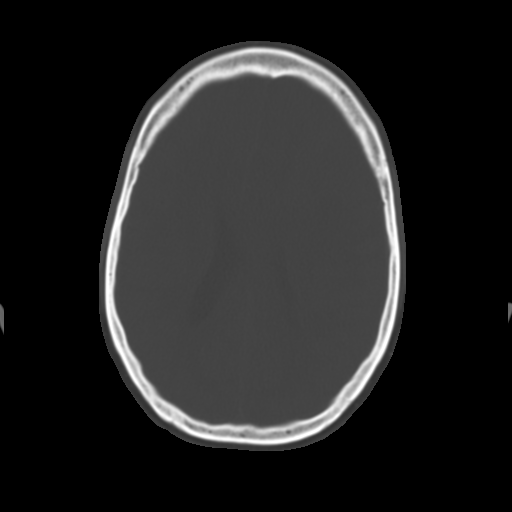
[im 21/32  brain]
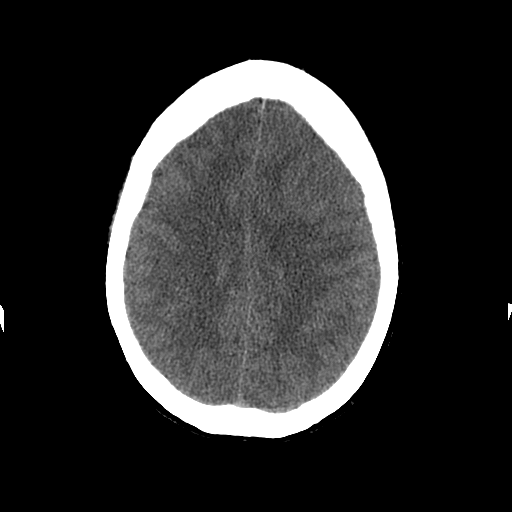
[im 25/32  brain]
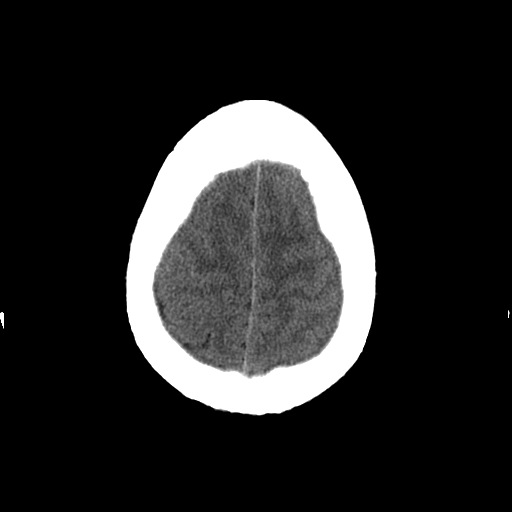
[im 28/32  brain]
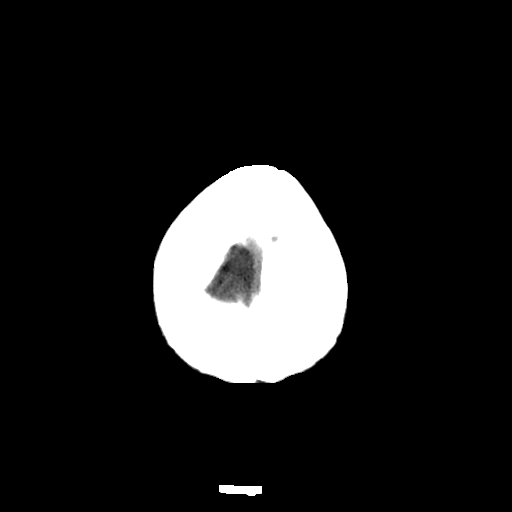

[Series 202: head w/o bone, idose (1) · axial · non-contrast · 0.49mm/px · z∈[+110,+143]mm · 3 of 64 slices shown]
[im 7/64  bone]
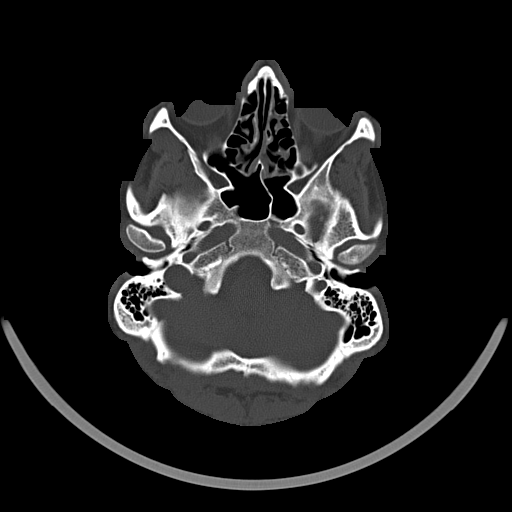
[im 14/64  bone]
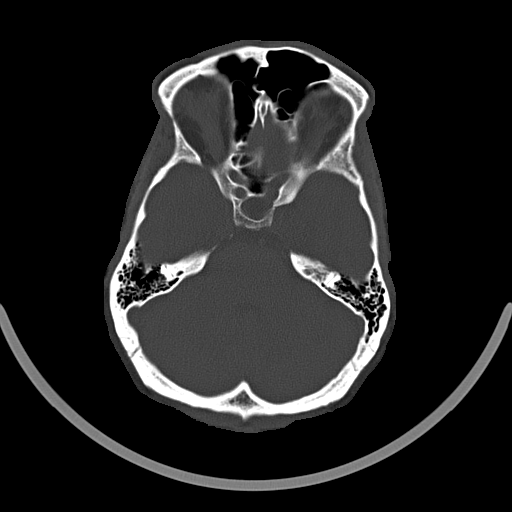
[im 20/64  bone]
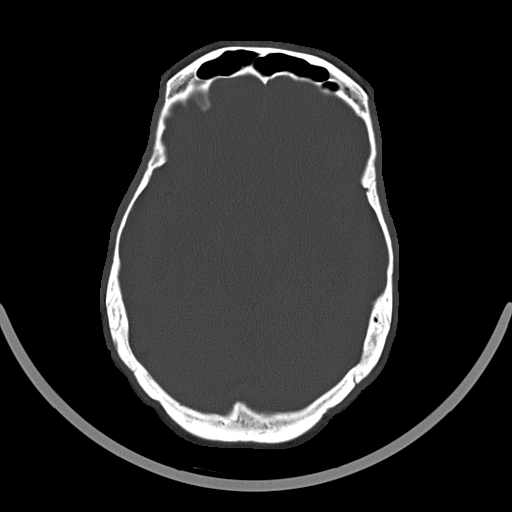

[Series 203: coronal st, idose (1) · coronal · 0.40mm/px · 3 of 79 slices shown]
[im 27/79  brain]
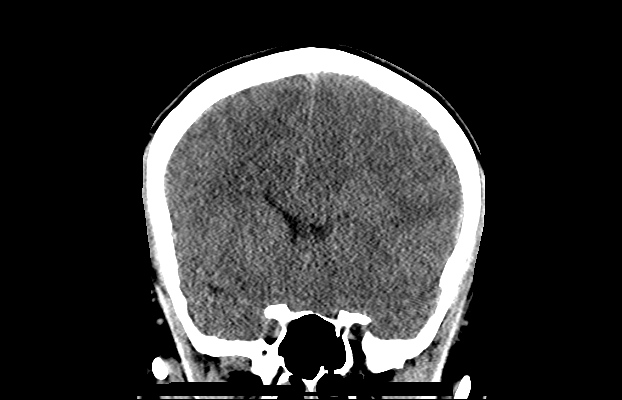
[im 35/79  brain]
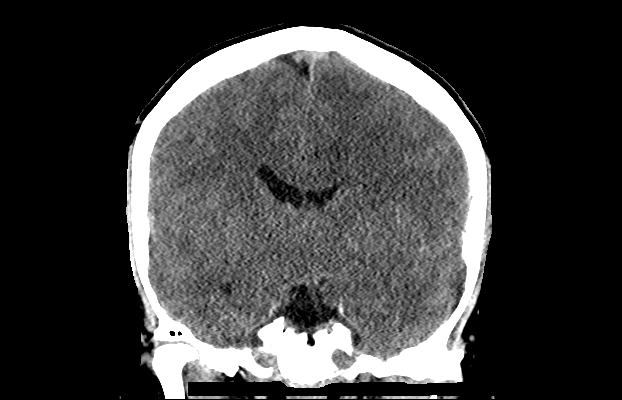
[im 44/79  brain]
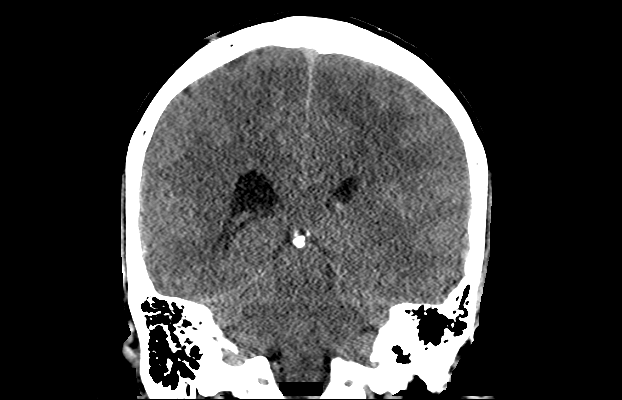

[Series 204: sagittal st, idose (1) · sagittal · 0.40mm/px · 3 of 83 slices shown]
[im 28/83  brain]
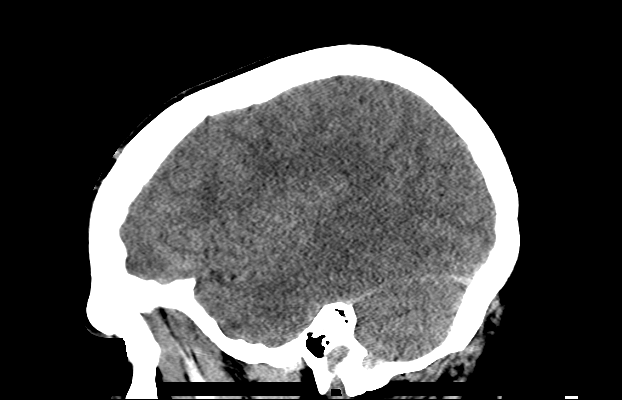
[im 42/83  brain]
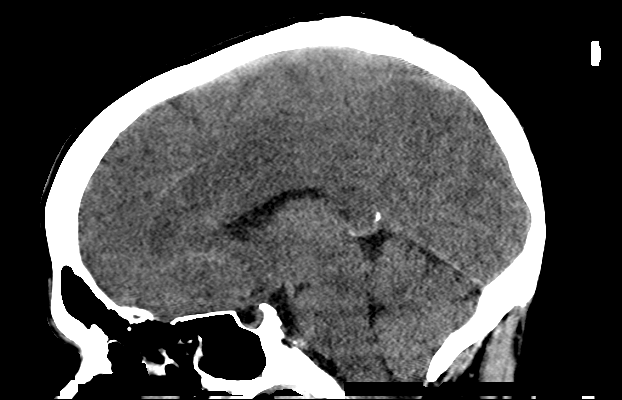
[im 55/83  brain]
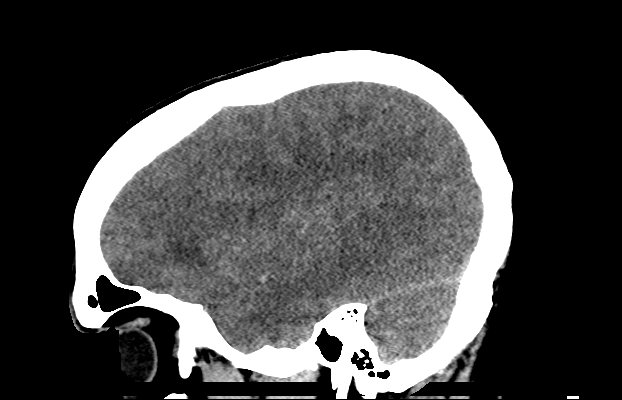

[17 of 47 positions shown; findings below may reference images not displayed]

FINDINGS: Brain: There is prominent mass effect within the left frontal lobe,
with effacement of the left lateral ventricle and a rightward shift
of the anterior falx. There is a probable mass within the left
frontal lobe which measures at least 4 x 3.7 cm on image 21 of
series 201. Suspected mass extension and/or associated edema within
the more inferior aspects of the left frontal lobe, with possible
extension across the anterior corpus callosum.

Vascular: Majority of the visible 83intracranial vessels are
hyperdense suggesting pseudo-subarachnoid sign caused by surrounding
brain edema. No convincing evidence of parenchymal or extra-axial
hemorrhage.

Skull: Negative for fracture or focal lesion.

Sinuses/Orbits: No acute findings.

Other: None.
IMPRESSION: Probable large mass within the left frontal lobe, measuring at least
4 x 3.7 cm on image 21 of series 201, but with suspected mass
extension and/or associated edema within the more inferior aspects
of the left frontal lobe. There is associated mass effect on the
frontal horn of the left lateral ventricle and rightward shift of
the anterior falx. Leading differential possibilities include
neoplastic masses such as GBM and lymphoma.

Recommend MRI with contrast for further characterization.

These results were called by telephone at the time of interpretation
on 08/13/2015 at [DATE] to Dr. YUNALDI MARGASAKTI , who verbally
acknowledged these results.

## 2018-04-06 IMAGING — MR MR HEAD WO/W CM
15 of 17 series · 28 of 48 positions shown · IV contrast (multihance)
Comparison: CT head performed a few hours earlier.

CLINICAL DATA: Witnessed seizure earlier today. Normal neurologic
exam.

EXAM:
MRI HEAD WITHOUT AND WITH CONTRAST
TECHNIQUE: Multiplanar, multiecho pulse sequences of the brain and surrounding
structures were obtained without and with intravenous contrast.
CONTRAST:  MultiHance 20 mL.

[Series 3: DWI · axial · 3.0mm · 0.94mm/px · z∈[-92,+48]mm · 5 of 96 slices shown (1 of 2)]
[im 1/96]
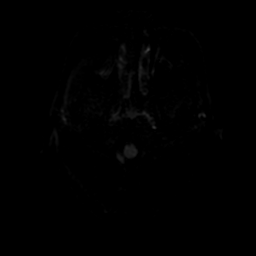
[im 24/96]
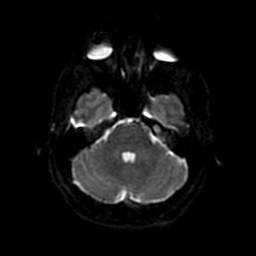
[im 48/96]
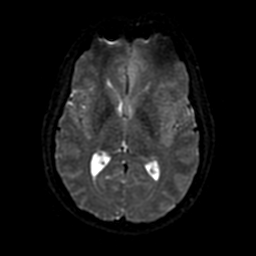
[im 72/96]
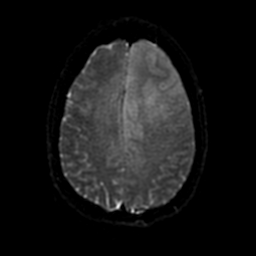
[im 96/96]
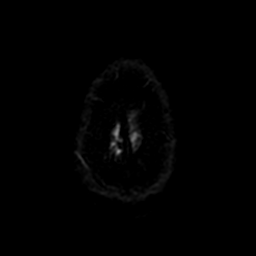

[Series 4: FLAIR · sagittal · 5.0mm · 0.47mm/px · 1 of 23 slices shown (1 of 3)]
[im 1/23]
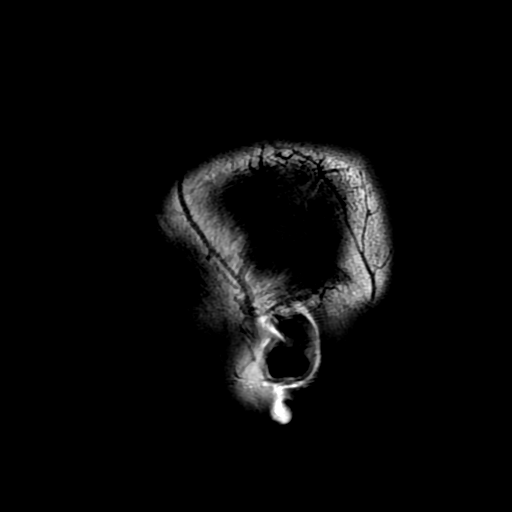

[Series 5: T2 · axial · 5.0mm · 0.47mm/px · 1 of 24 slices shown (1 of 2)]
[im 1/24]
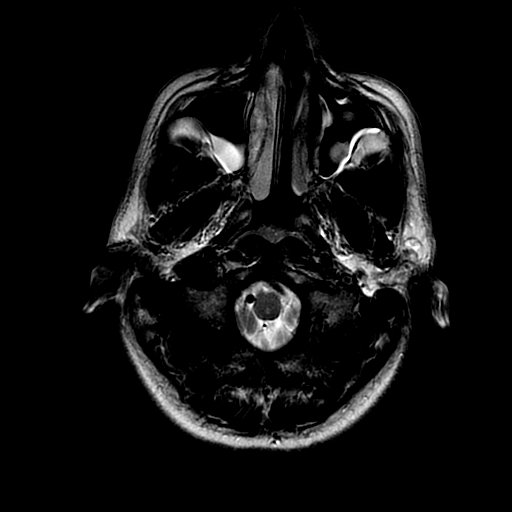

[Series 6: FLAIR · axial · 5.0mm · 0.47mm/px · 1 of 24 slices shown (2 of 3)]
[im 1/24]
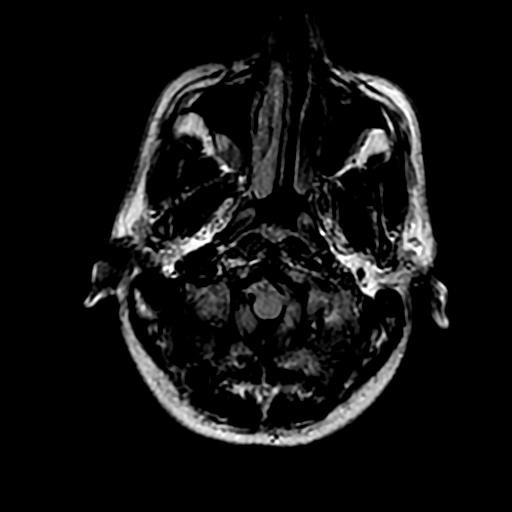

[Series 7: DWI · coronal · 4.0mm · 0.94mm/px · 4 of 72 slices shown (2 of 2)]
[im 1/72]
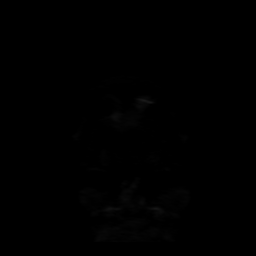
[im 24/72]
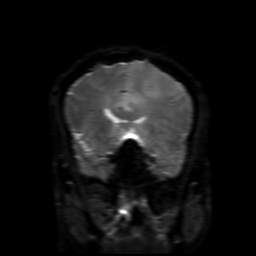
[im 48/72]
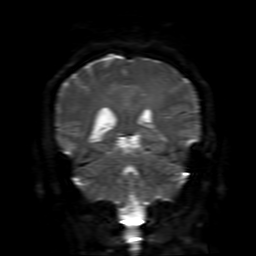
[im 72/72]
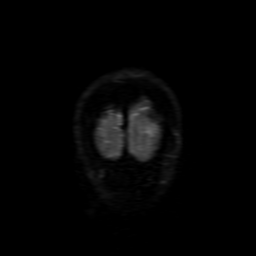

[Series 8: FLAIR · axial · 5.0mm · 0.94mm/px · 1 of 24 slices shown (3 of 3)]
[im 1/24]
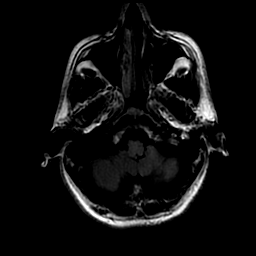

[Series 9: (person_name) · axial · 3.0mm · 0.47mm/px · z∈[-100,-66]mm · 2 of 96 slices shown]
[im 1/96]
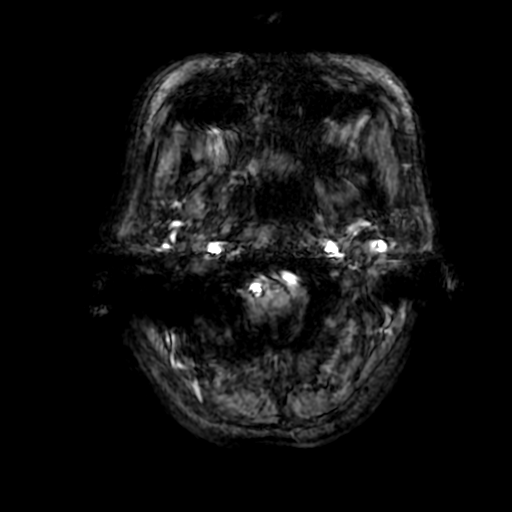
[im 24/96]
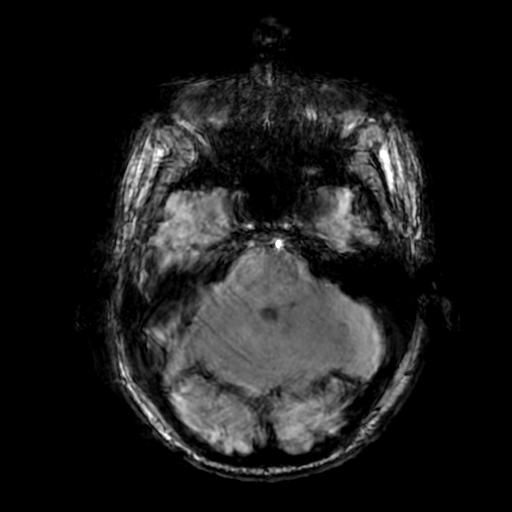

[Series 11: T2 fat-sat · coronal · 3.0mm · 0.39mm/px · 2 of 30 slices shown]
[im 1/30]
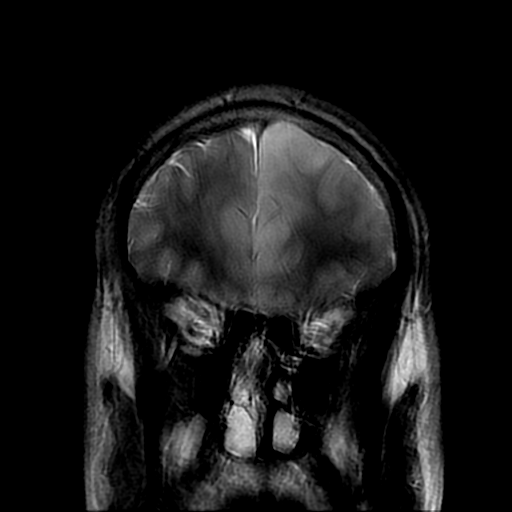
[im 30/30]
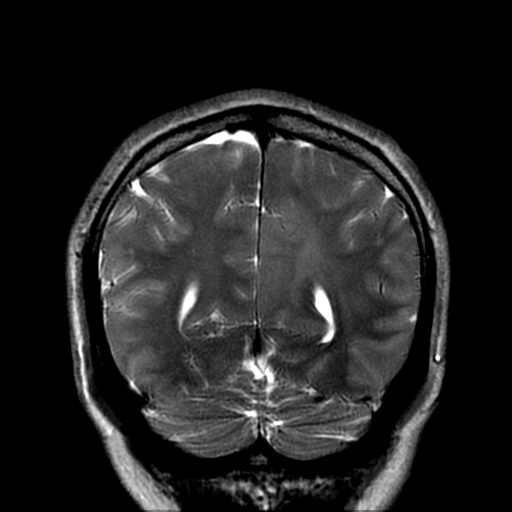

[Series 12: GRE · axial · 5.0mm · 0.47mm/px · 1 of 25 slices shown]
[im 1/25]
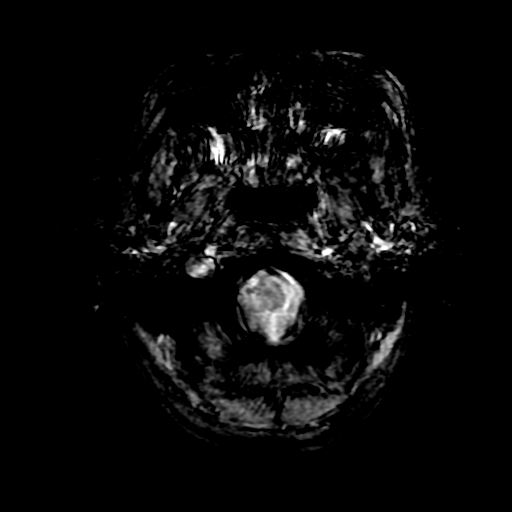

[Series 13: T2 · coronal · 5.0mm · 0.39mm/px · 2 of 30 slices shown (2 of 2)]
[im 1/30]
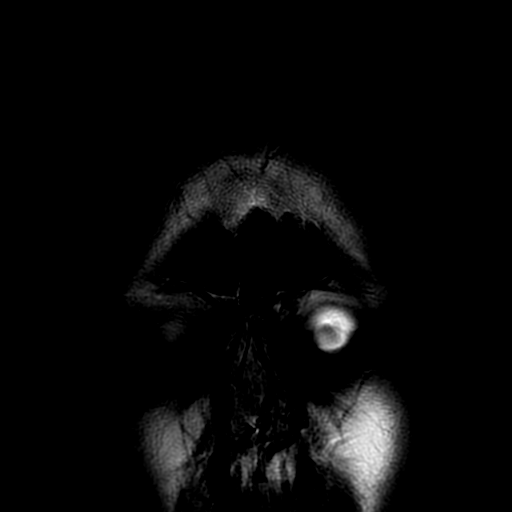
[im 30/30]
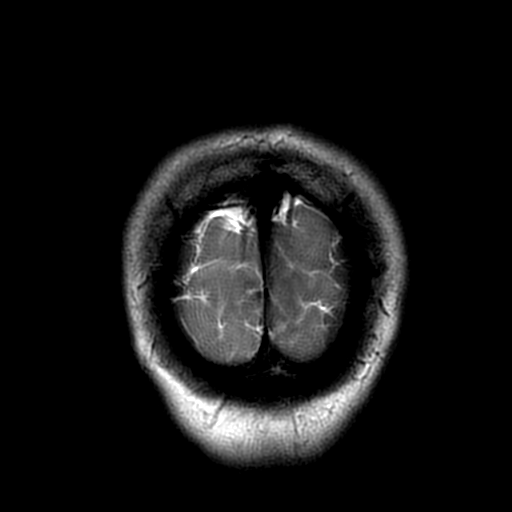

[Series 17: T1 · axial · 5.0mm · 0.47mm/px · 1 of 28 slices shown (1 of 2)]
[im 1/28]
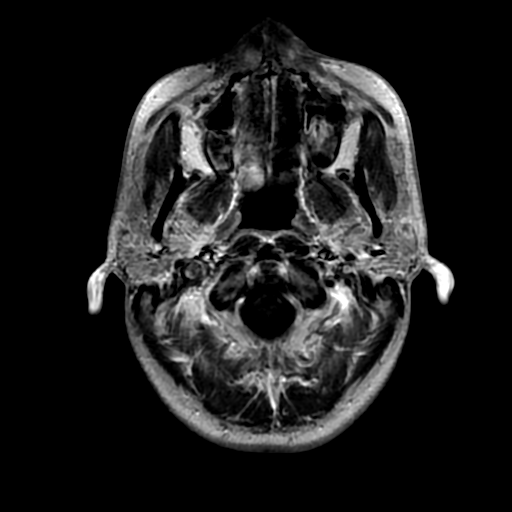

[Series 18: T1 · coronal · 5.0mm · 0.39mm/px · 2 of 30 slices shown (2 of 2)]
[im 1/30]
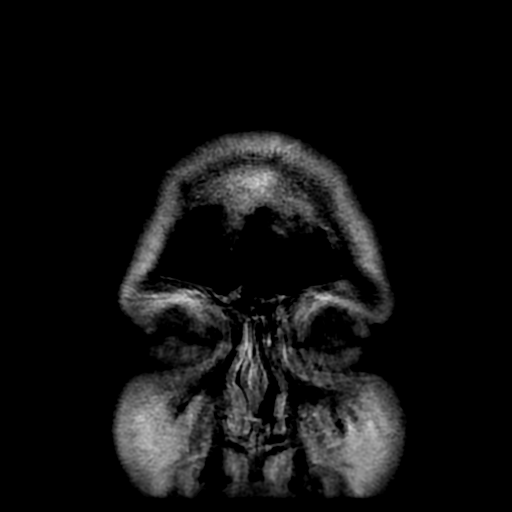
[im 30/30]
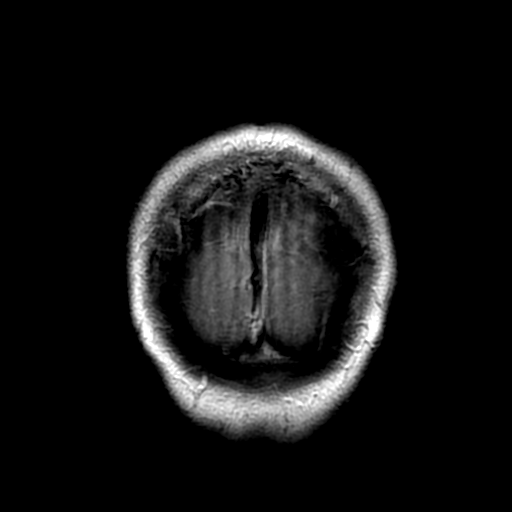

[Series 19: FLAIR post-contrast · sagittal · 5.0mm · 0.47mm/px · 1 of 23 slices shown]
[im 1/23]
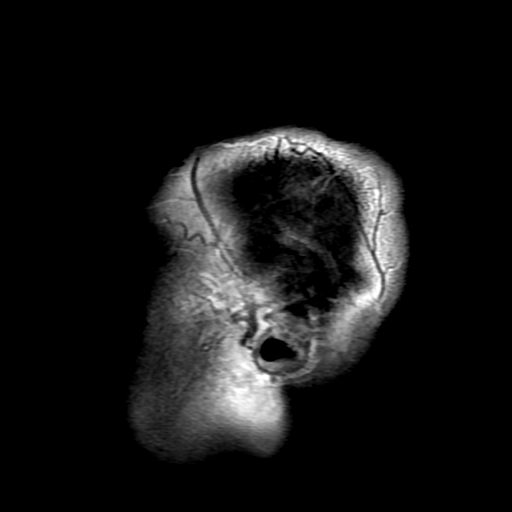

[Series 350: ADC · axial · 3.0mm · 0.94mm/px · z∈[-92,+48]mm · 2 of 48 slices shown (1 of 2)]
[im 1/48]
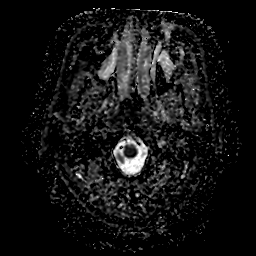
[im 48/48]
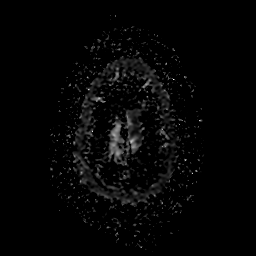

[Series 750: ADC · coronal · 4.0mm · 0.94mm/px · 2 of 36 slices shown (2 of 2)]
[im 1/36]
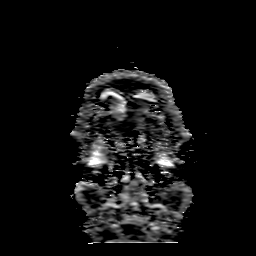
[im 36/36]
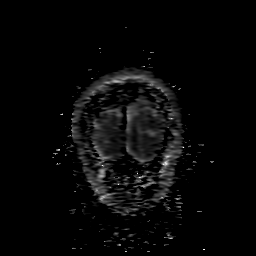

[28 of 48 positions shown; findings below may reference images not displayed]

FINDINGS: Infiltrative LEFT frontal mass, mildly restricted on DWI over a 3 cm
cross-sectional area. Infiltrative T2 and FLAIR hyperintensities
throughout the entire frontal lobe, infiltration of the corpus
callosum across the genu and body, also involving the LEFT insula,
and LEFT temporal lobe as well as LEFT basal ganglia with similar
abnormal signal in the RIGHT frontal lobe. Focal and confluent T2
and FLAIR hyperintensities in the RIGHT centrum semiovale and around
the parieto-occipital periatrial regions, are possibly, but not
clearly related. Diffuse mass effect in the LEFT frontal lobe with
sulcal effacement.

Mild mass effect left-to-right, 4 mm, with slight LEFT uncal
herniation, and brainstem rotation.

Post infusion, there are scattered foci of enhancement, relatively
minor and out of proportion to the degree of mass effect and T2
hyperintensity. The largest area of enhancement in the LEFT superior
frontal cortex measures 16 x 7 x 18 mm (R-L x A-P x C-C). There is a
curvilinear focus of enhancement also noted in the medial RIGHT
frontal lobe (image 19 series 17). No areas of hemorrhage or central
necrosis.

Flow voids are maintained.  Extracranial soft tissues unremarkable.
IMPRESSION: Infiltrative mass involving multiple brain regions, epicenter in the
LEFT frontal lobe, with contralateral spread, mild restriction,
disproportionate paucity of enhancement, and no central necrosis or
hemorrhage.

Gliomatosis cerebri is favored. Advanced, infiltrative, GBM cannot
be excluded.
